# Patient Record
Sex: Female | Born: 1970 | Race: White | Hispanic: No | Marital: Married | Smoking: Never smoker
Health system: Southern US, Community
[De-identification: ages and names within clinical notes are randomized; demographics above are authoritative.]

## PROBLEM LIST (undated history)

## (undated) DIAGNOSIS — R102 Pelvic and perineal pain unspecified side: Secondary | ICD-10-CM

## (undated) DIAGNOSIS — Z8719 Personal history of other diseases of the digestive system: Secondary | ICD-10-CM

## (undated) DIAGNOSIS — M549 Dorsalgia, unspecified: Secondary | ICD-10-CM

## (undated) DIAGNOSIS — N3010 Interstitial cystitis (chronic) without hematuria: Secondary | ICD-10-CM

## (undated) DIAGNOSIS — D649 Anemia, unspecified: Secondary | ICD-10-CM

## (undated) DIAGNOSIS — I10 Essential (primary) hypertension: Secondary | ICD-10-CM

## (undated) DIAGNOSIS — F32A Depression, unspecified: Secondary | ICD-10-CM

## (undated) DIAGNOSIS — R110 Nausea: Secondary | ICD-10-CM

## (undated) DIAGNOSIS — N83209 Unspecified ovarian cyst, unspecified side: Secondary | ICD-10-CM

## (undated) DIAGNOSIS — Z9884 Bariatric surgery status: Secondary | ICD-10-CM

## (undated) DIAGNOSIS — G43909 Migraine, unspecified, not intractable, without status migrainosus: Secondary | ICD-10-CM

## (undated) DIAGNOSIS — G8929 Other chronic pain: Secondary | ICD-10-CM

## (undated) DIAGNOSIS — R000 Tachycardia, unspecified: Secondary | ICD-10-CM

## (undated) DIAGNOSIS — Z789 Other specified health status: Secondary | ICD-10-CM

## (undated) DIAGNOSIS — F329 Major depressive disorder, single episode, unspecified: Secondary | ICD-10-CM

## (undated) DIAGNOSIS — N809 Endometriosis, unspecified: Secondary | ICD-10-CM

## (undated) HISTORY — PX: OTHER SURGICAL HISTORY: SHX169

## (undated) HISTORY — PX: GASTRIC BYPASS: SHX52

## (undated) HISTORY — PX: DILATION AND CURETTAGE OF UTERUS: SHX78

---
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
## 1998-11-11 ENCOUNTER — Other Ambulatory Visit: Admission: RE | Admit: 1998-11-11 | Discharge: 1998-11-11 | Payer: Self-pay | Admitting: Obstetrics and Gynecology

## 1999-12-05 ENCOUNTER — Other Ambulatory Visit: Admission: RE | Admit: 1999-12-05 | Discharge: 1999-12-05 | Payer: Self-pay | Admitting: Obstetrics and Gynecology

## 2000-12-11 ENCOUNTER — Other Ambulatory Visit: Admission: RE | Admit: 2000-12-11 | Discharge: 2000-12-11 | Payer: Self-pay | Admitting: Obstetrics and Gynecology

## 2001-12-15 ENCOUNTER — Other Ambulatory Visit: Admission: RE | Admit: 2001-12-15 | Discharge: 2001-12-15 | Payer: Self-pay | Admitting: Obstetrics and Gynecology

## 2003-01-08 ENCOUNTER — Other Ambulatory Visit: Admission: RE | Admit: 2003-01-08 | Discharge: 2003-01-08 | Payer: Self-pay | Admitting: Obstetrics and Gynecology

## 2003-02-10 ENCOUNTER — Ambulatory Visit (HOSPITAL_COMMUNITY): Admission: RE | Admit: 2003-02-10 | Discharge: 2003-02-10 | Payer: Self-pay | Admitting: Obstetrics and Gynecology

## 2003-02-10 ENCOUNTER — Encounter (INDEPENDENT_AMBULATORY_CARE_PROVIDER_SITE_OTHER): Payer: Self-pay

## 2003-02-10 HISTORY — PX: HYSTEROSCOPY W/D&C: SHX1775

## 2003-03-31 ENCOUNTER — Encounter: Admission: RE | Admit: 2003-03-31 | Discharge: 2003-06-29 | Payer: Self-pay | Admitting: *Deleted

## 2003-04-14 ENCOUNTER — Encounter: Payer: Self-pay | Admitting: Cardiology

## 2003-04-14 ENCOUNTER — Ambulatory Visit (HOSPITAL_COMMUNITY): Admission: RE | Admit: 2003-04-14 | Discharge: 2003-04-14 | Payer: Self-pay | Admitting: *Deleted

## 2003-04-15 ENCOUNTER — Ambulatory Visit (HOSPITAL_BASED_OUTPATIENT_CLINIC_OR_DEPARTMENT_OTHER): Admission: RE | Admit: 2003-04-15 | Discharge: 2003-04-15 | Payer: Self-pay | Admitting: *Deleted

## 2003-04-16 ENCOUNTER — Encounter (INDEPENDENT_AMBULATORY_CARE_PROVIDER_SITE_OTHER): Payer: Self-pay | Admitting: Internal Medicine

## 2003-04-16 ENCOUNTER — Encounter: Admission: RE | Admit: 2003-04-16 | Discharge: 2003-04-16 | Payer: Self-pay | Admitting: *Deleted

## 2003-05-17 ENCOUNTER — Encounter: Payer: Self-pay | Admitting: *Deleted

## 2003-05-18 ENCOUNTER — Observation Stay (HOSPITAL_COMMUNITY): Admission: EM | Admit: 2003-05-18 | Discharge: 2003-05-18 | Payer: Self-pay | Admitting: *Deleted

## 2003-05-19 ENCOUNTER — Encounter (HOSPITAL_COMMUNITY): Admission: RE | Admit: 2003-05-19 | Discharge: 2003-06-18 | Payer: Self-pay | Admitting: *Deleted

## 2003-05-19 ENCOUNTER — Encounter: Payer: Self-pay | Admitting: Cardiology

## 2003-05-19 ENCOUNTER — Encounter (INDEPENDENT_AMBULATORY_CARE_PROVIDER_SITE_OTHER): Payer: Self-pay | Admitting: Internal Medicine

## 2003-06-14 ENCOUNTER — Inpatient Hospital Stay (HOSPITAL_COMMUNITY): Admission: RE | Admit: 2003-06-14 | Discharge: 2003-06-17 | Payer: Self-pay | Admitting: General Surgery

## 2003-06-14 HISTORY — PX: ROUX-EN-Y PROCEDURE: SUR1287

## 2003-06-15 ENCOUNTER — Encounter: Payer: Self-pay | Admitting: Surgery

## 2003-06-15 ENCOUNTER — Encounter (INDEPENDENT_AMBULATORY_CARE_PROVIDER_SITE_OTHER): Payer: Self-pay | Admitting: Family Medicine

## 2003-07-22 ENCOUNTER — Encounter: Admission: RE | Admit: 2003-07-22 | Discharge: 2003-10-20 | Payer: Self-pay | Admitting: *Deleted

## 2004-01-19 ENCOUNTER — Other Ambulatory Visit: Admission: RE | Admit: 2004-01-19 | Discharge: 2004-01-19 | Payer: Self-pay | Admitting: Obstetrics and Gynecology

## 2004-03-23 ENCOUNTER — Encounter: Admission: RE | Admit: 2004-03-23 | Discharge: 2004-03-23 | Payer: Self-pay | Admitting: *Deleted

## 2004-11-06 ENCOUNTER — Encounter: Admission: RE | Admit: 2004-11-06 | Discharge: 2004-11-06 | Payer: Self-pay | Admitting: Neurology

## 2005-02-01 ENCOUNTER — Other Ambulatory Visit: Admission: RE | Admit: 2005-02-01 | Discharge: 2005-02-01 | Payer: Self-pay | Admitting: Obstetrics and Gynecology

## 2005-09-24 HISTORY — PX: ENDOMETRIAL ABLATION W/ NOVASURE: SUR434

## 2006-01-02 ENCOUNTER — Ambulatory Visit (HOSPITAL_BASED_OUTPATIENT_CLINIC_OR_DEPARTMENT_OTHER): Admission: RE | Admit: 2006-01-02 | Discharge: 2006-01-02 | Payer: Self-pay | Admitting: Urology

## 2006-01-02 ENCOUNTER — Encounter (INDEPENDENT_AMBULATORY_CARE_PROVIDER_SITE_OTHER): Payer: Self-pay | Admitting: Specialist

## 2006-01-02 ENCOUNTER — Encounter (INDEPENDENT_AMBULATORY_CARE_PROVIDER_SITE_OTHER): Payer: Self-pay | Admitting: Family Medicine

## 2006-01-02 HISTORY — PX: OTHER SURGICAL HISTORY: SHX169

## 2006-02-04 ENCOUNTER — Ambulatory Visit: Payer: Self-pay | Admitting: Internal Medicine

## 2006-03-04 ENCOUNTER — Ambulatory Visit: Payer: Self-pay | Admitting: Internal Medicine

## 2006-03-06 ENCOUNTER — Telehealth (INDEPENDENT_AMBULATORY_CARE_PROVIDER_SITE_OTHER): Payer: Self-pay | Admitting: Internal Medicine

## 2006-04-03 ENCOUNTER — Ambulatory Visit: Payer: Self-pay | Admitting: Internal Medicine

## 2006-05-01 ENCOUNTER — Ambulatory Visit: Payer: Self-pay | Admitting: Internal Medicine

## 2006-05-09 ENCOUNTER — Telehealth (INDEPENDENT_AMBULATORY_CARE_PROVIDER_SITE_OTHER): Payer: Self-pay | Admitting: Internal Medicine

## 2006-05-10 ENCOUNTER — Ambulatory Visit: Payer: Self-pay | Admitting: Internal Medicine

## 2006-05-27 ENCOUNTER — Encounter (INDEPENDENT_AMBULATORY_CARE_PROVIDER_SITE_OTHER): Payer: Self-pay | Admitting: Internal Medicine

## 2006-05-31 ENCOUNTER — Encounter (INDEPENDENT_AMBULATORY_CARE_PROVIDER_SITE_OTHER): Payer: Self-pay | Admitting: Internal Medicine

## 2006-06-05 ENCOUNTER — Ambulatory Visit: Payer: Self-pay | Admitting: Internal Medicine

## 2006-07-10 ENCOUNTER — Ambulatory Visit: Payer: Self-pay | Admitting: Internal Medicine

## 2006-07-19 ENCOUNTER — Telehealth (INDEPENDENT_AMBULATORY_CARE_PROVIDER_SITE_OTHER): Payer: Self-pay | Admitting: Internal Medicine

## 2006-10-01 ENCOUNTER — Telehealth (INDEPENDENT_AMBULATORY_CARE_PROVIDER_SITE_OTHER): Payer: Self-pay | Admitting: Internal Medicine

## 2006-10-11 ENCOUNTER — Encounter (INDEPENDENT_AMBULATORY_CARE_PROVIDER_SITE_OTHER): Payer: Self-pay | Admitting: Internal Medicine

## 2006-10-28 ENCOUNTER — Encounter: Payer: Self-pay | Admitting: Internal Medicine

## 2006-10-28 DIAGNOSIS — F329 Major depressive disorder, single episode, unspecified: Secondary | ICD-10-CM

## 2006-10-28 DIAGNOSIS — N840 Polyp of corpus uteri: Secondary | ICD-10-CM

## 2006-10-28 DIAGNOSIS — M545 Low back pain, unspecified: Secondary | ICD-10-CM | POA: Insufficient documentation

## 2006-10-28 DIAGNOSIS — K589 Irritable bowel syndrome without diarrhea: Secondary | ICD-10-CM | POA: Insufficient documentation

## 2006-10-28 DIAGNOSIS — R32 Unspecified urinary incontinence: Secondary | ICD-10-CM | POA: Insufficient documentation

## 2006-10-28 DIAGNOSIS — K5900 Constipation, unspecified: Secondary | ICD-10-CM | POA: Insufficient documentation

## 2006-10-28 DIAGNOSIS — F411 Generalized anxiety disorder: Secondary | ICD-10-CM | POA: Insufficient documentation

## 2006-10-28 DIAGNOSIS — N318 Other neuromuscular dysfunction of bladder: Secondary | ICD-10-CM

## 2006-12-05 ENCOUNTER — Telehealth (INDEPENDENT_AMBULATORY_CARE_PROVIDER_SITE_OTHER): Payer: Self-pay | Admitting: *Deleted

## 2007-01-06 ENCOUNTER — Encounter (INDEPENDENT_AMBULATORY_CARE_PROVIDER_SITE_OTHER): Payer: Self-pay | Admitting: Internal Medicine

## 2007-01-09 ENCOUNTER — Encounter (INDEPENDENT_AMBULATORY_CARE_PROVIDER_SITE_OTHER): Payer: Self-pay | Admitting: Internal Medicine

## 2008-04-28 ENCOUNTER — Emergency Department (HOSPITAL_COMMUNITY): Admission: EM | Admit: 2008-04-28 | Discharge: 2008-04-28 | Payer: Self-pay | Admitting: Emergency Medicine

## 2010-01-26 ENCOUNTER — Emergency Department (HOSPITAL_COMMUNITY): Admission: EM | Admit: 2010-01-26 | Discharge: 2010-01-26 | Payer: Self-pay | Admitting: Emergency Medicine

## 2010-11-20 ENCOUNTER — Emergency Department (HOSPITAL_COMMUNITY)
Admission: EM | Admit: 2010-11-20 | Discharge: 2010-11-21 | Disposition: A | Discharge status: Home / Self Care | Payer: 59 | Attending: Emergency Medicine | Admitting: Emergency Medicine

## 2010-11-20 DIAGNOSIS — Z79899 Other long term (current) drug therapy: Secondary | ICD-10-CM | POA: Insufficient documentation

## 2010-11-20 DIAGNOSIS — F3289 Other specified depressive episodes: Secondary | ICD-10-CM | POA: Insufficient documentation

## 2010-11-20 DIAGNOSIS — F329 Major depressive disorder, single episode, unspecified: Secondary | ICD-10-CM | POA: Insufficient documentation

## 2010-11-20 DIAGNOSIS — F1910 Other psychoactive substance abuse, uncomplicated: Secondary | ICD-10-CM | POA: Insufficient documentation

## 2010-11-20 LAB — CBC
HCT: 33 % — ABNORMAL LOW (ref 36.0–46.0)
Hemoglobin: 11 g/dL — ABNORMAL LOW (ref 12.0–15.0)
MCHC: 33.3 g/dL (ref 30.0–36.0)
MCV: 86.4 fL (ref 78.0–100.0)
RDW: 13.8 % (ref 11.5–15.5)
WBC: 6.2 10*3/uL (ref 4.0–10.5)

## 2010-11-20 LAB — RAPID URINE DRUG SCREEN, HOSP PERFORMED
Amphetamines: NOT DETECTED
Cocaine: NOT DETECTED
Opiates: POSITIVE — AB
Tetrahydrocannabinol: NOT DETECTED

## 2010-11-20 LAB — DIFFERENTIAL
Basophils Absolute: 0 10*3/uL (ref 0.0–0.1)
Eosinophils Relative: 4 % (ref 0–5)
Lymphocytes Relative: 44 % (ref 12–46)
Lymphs Abs: 2.7 10*3/uL (ref 0.7–4.0)
Monocytes Absolute: 0.4 10*3/uL (ref 0.1–1.0)
Neutro Abs: 2.8 10*3/uL (ref 1.7–7.7)

## 2010-11-21 LAB — URINALYSIS, ROUTINE W REFLEX MICROSCOPIC
Ketones, ur: NEGATIVE mg/dL
Nitrite: POSITIVE — AB
Protein, ur: NEGATIVE mg/dL
Urobilinogen, UA: 0.2 mg/dL (ref 0.0–1.0)

## 2010-11-21 LAB — BASIC METABOLIC PANEL
BUN: 13 mg/dL (ref 6–23)
Calcium: 8.9 mg/dL (ref 8.4–10.5)
GFR calc non Af Amer: >60 mL/min (ref >60)
Glucose, Bld: 115 mg/dL — ABNORMAL HIGH (ref 70–99)

## 2010-11-21 LAB — ETHANOL: Alcohol, Ethyl (B): <5 mg/dL (ref 0–10)

## 2010-12-12 LAB — POCT PREGNANCY, URINE: Preg Test, Ur: NEGATIVE

## 2010-12-12 LAB — CBC
HCT: 35.7 % — ABNORMAL LOW (ref 36.0–46.0)
Hemoglobin: 12.4 g/dL (ref 12.0–15.0)
MCV: 91.1 fL (ref 78.0–100.0)
RBC: 3.92 MIL/uL (ref 3.87–5.11)
WBC: 8.8 10*3/uL (ref 4.0–10.5)

## 2010-12-12 LAB — URINALYSIS, ROUTINE W REFLEX MICROSCOPIC
Bilirubin Urine: NEGATIVE
Ketones, ur: NEGATIVE mg/dL
Nitrite: NEGATIVE
Specific Gravity, Urine: 1.006 (ref 1.005–1.030)
Urobilinogen, UA: 0.2 mg/dL (ref 0.0–1.0)

## 2010-12-12 LAB — COMPREHENSIVE METABOLIC PANEL
AST: 18 U/L (ref 0–37)
BUN: 13 mg/dL (ref 6–23)
CO2: 26 mEq/L (ref 19–32)
Chloride: 104 mEq/L (ref 96–112)
Creatinine, Ser: 0.6 mg/dL (ref 0.4–1.2)
GFR calc Af Amer: >60 mL/min (ref >60)
GFR calc non Af Amer: >60 mL/min (ref >60)
Glucose, Bld: 83 mg/dL (ref 70–99)
Total Bilirubin: 0.5 mg/dL (ref 0.3–1.2)

## 2010-12-12 LAB — DIFFERENTIAL
Basophils Absolute: 0 10*3/uL (ref 0.0–0.1)
Eosinophils Absolute: 0.2 10*3/uL (ref 0.0–0.7)
Eosinophils Relative: 2 % (ref 0–5)
Lymphocytes Relative: 34 % (ref 12–46)
Neutrophils Relative %: 56 % (ref 43–77)

## 2011-02-09 NOTE — Op Note (Signed)
NAME:  Jordan Velez, Jordan Velez                          ACCOUNT NO.:  [id number]   MEDICAL RECORD NO.:  [id number]                   PATIENT TYPE:  AMB   LOCATION:  SDC                                  FACILITY:  [facility]   PHYSICIAN:  Dineen Kid. Rana Snare, M.D.                 DATE OF BIRTH:  1970-10-31   DATE OF PROCEDURE:  02/10/2003  DATE OF DISCHARGE:                                 OPERATIVE REPORT   PREOPERATIVE DIAGNOSIS:  Abnormal uterine bleeding and endometrial mass.   POSTOPERATIVE DIAGNOSIS:  Abnormal uterine bleeding and endometrial mass.   PROCEDURE:  Hysteroscopy D&C.   SURGEON:  Dineen Kid. Rana Snare, M.D.   ANESTHESIA:  Monitored anesthesia care and paracervical block.   ESTIMATED BLOOD LOSS:  Less than 10 mL.   SORBITOL DEFICIT:  30 mL.   INDICATIONS FOR PROCEDURE:  The patient is a 40 year old G1, P1 with  menometrorrhagia.  She underwent a sonohysterogram which shows a 3.8 mm  polyp on the left lateral wall.  She presents for hysteroscopy D&C for  evaluation and removal of the polyp and bleeding.  Risks and benefits were  discussed at length.  Informed consent was obtained.   FINDINGS:  Thickened endometrial wall throughout, the polypoid martial in  the right cornu.  Normal-appearing cervix and normal-appearing ostia after  D&C.   DESCRIPTION OF PROCEDURE:  After adequate analgesia, the patient is placed  in the dorsal lithotomy position.  She is sterilely prepped and draped.  The  bladder was sterilely drained.  After sterile prep of the cervix, tenaculum  was placed in the anterior lip of the cervix and a paracervical block was  placed with 1% Xylocaine with 1:100,000 epinephrine 20 mL total used.  Uterus was sounded to 8 cm and easily dilated to a #27 Pratt dilator and the  hysteroscope was inserted.  The above findings were noted.  Because of the  thickened endometrium, we went ahead and proceeded with the curettage which  retrieved small fragments of endometrial tissue after  good _______ was felt  throughout the endometrial cavity by curettage, a relook with a hysteroscope  revealed normal-appearing ostia, normal-appearing cervix and a small  polypoid fragments toward the right cornu.  This was removed with polyp  forceps.  Sharp curettage was performed again and a reexamination revealed  normal-appearing endometrial cavity with no residual polypoid or thickened  endometrium.  At this point, the hysteroscope was removed.  The tenaculum  was removed from the cervix.  Abdomen was noted to be hemostatic.  Speculum  was then removed and the patient was transferred to the recovery room in  stable condition.  The estimated blood loss was less than 10 mL.  The  sorbitol deficit during the procedure was 30 mL.    DISPOSITION:  The patient is discharged home and will follow up in the  office in two to three weeks, set  up with a routine instruction sheet for  D&C.  She was told to return for increased pain, bleeding or fever.                                               Dineen Kid Rana Snare, M.D.    DCL/MEDQ  D:  02/10/2003  T:  02/10/2003  Job:  [id number]

## 2011-02-09 NOTE — Op Note (Signed)
NAME:  Jordan Velez, Jordan Velez                          ACCOUNT NO.:  [id number]   MEDICAL RECORD NO.:  [id number]                   PATIENT TYPE:  INP   LOCATION:  0001                                 FACILITY:  [facility]   PHYSICIAN:  Sandria Bales. Ezzard Standing, M.D.               DATE OF BIRTH:  1971/07/06   DATE OF PROCEDURE:  06/14/2003  DATE OF DISCHARGE:                                 OPERATIVE REPORT   PREOPERATIVE DIAGNOSES:  Morbid obesity with a BMI of approximately 44.   POSTOPERATIVE DIAGNOSES:  Morbid obesity a BMI of approximately 44.   PROCEDURE:  Laparoscopic Roux-en-Y gastrojejunostomy which is antecolic  antegastric and then esophagogastroscopy with biopsy by Dr. Jaclynn Guarneri.   SURGEON:  Sandria Bales. Ezzard Standing, M.D.   FIRST ASSISTANT:  Thornton Park. Daphine Deutscher, M.D. and Sharlet Salina T. Hoxworth, M.D.   ANESTHESIA:  General endotracheal.   ESTIMATED BLOOD LOSS:  Minimal.   INDICATIONS FOR PROCEDURE:  Ms. Myer is a 40 year old white female who has  been morbidly obese much of her adult life. She has multiple comorbid  problems including easily fatigues,  lower extremity degenerative joint  disease, menstrual irregularities and hypertension. She has been through our  preoperative evaluation of her morbid obesity and now comes for laparoscopic  roux-en-CY gastrojejunostomy. The potential indications and complications of  the procedure explained to the patient. Complications include but not  limited to bleeding, infection, leak from the bowel, deep venous thrombosis,  long-term nutritional consequences.   DESCRIPTION OF PROCEDURE:  The patient placed in the supine position with a  general endotracheal anesthetic as supervised by Dr. Eilene Ghazi. She has  had subcu Lovenox and Ancef as preoperative medications. She had PAS  stockings in place, her abdomen was prepped with Betadine solution and  sterilely draped.   I accessed the abdomen through the left upper quadrant using a Veress needle  into  the abdomen and insufflated this with CO2. I used the Optiview trocar  to insert into the abdominal cavity and I placed a second left subcostal  trocar with a 5 mm and left paramedian 12 mm,  right paramedian 12 mm, right  subcostal 12 mm and a right paraumbilical 10 mm trocars.   Abdominal exploration was carried out. The right and left lobes of the liver  were unremarkable. The anterior wall of the stomach was unremarkable. The  omentum was stuck down in the pelvis probably from the prior tubal ligation.  The ovaries were stuck on both lateral pelvic sidewalls but there was no  mass or nodularity. There was no other mass or lesion. I had to take the  omentum down at the initiation of the procedure.   I then went and found the ligament of Treitz. The ligament of Treitz was  actually peculiar in that the fourth portion of the ligament of Treitz made  kind of an S shape in the retroperitoneum, I  counted 40 cm beyond the  ligament of Treitz, divided the small bowel with a flat load of a GIA  stapler.   I then counted 100 cm from the cut end of the bowel down to where I would  make my jejunojejunostomy. I marked the gastric wound with a Penrose drain  and I did a stapled side to side anastomosis with a white load of the GIA  stapler and then I closed the enterotomy with a running 2-0 Vicryl sutures,  two sutures that tied in the middle and then some interrupted 2-0 Vicryls.   I then closed the mesentery with a running 2-0 silk suture using a lapper  tie on both ends. I then applied Tisseal and marked this in with two clips.   I then went up into the upper abdomen, placed the [facility] liver retractor,  identified the angle of His, along the left side of the esophagus and cardia  of the stomach and identified the left crus.   After creating a pocket there, I then went down along the lesser curvature  of the stomach approximately 4-5 cm immediately before the second main  vessel of the  stomach and got into the lesser sac from that direction. I  then used five firings of the blue Endo GIA stapler creating a tube-like  pouch which was approximately 5 cm in length, 3-4 cm in width. Hemostasis  was controlled both with the staples itself and then I had to put some  additional clips both on the gastric remnant side and on the stomach side.   I then brought the jejunal limb up antecolic antegastric, did a running  suture to the small bowel to the stomach, tied the distal end, used a liga  tie on the right end of this posterior suture. I then used again another  blue load of the Endo GIA after I had made enterotomies both into the  stomach and into the small bowel, fired the Endo GIA stapler getting back to  about the E on the 45 stapler trying to make about a 2 1/2 cm opening. I  then closed the enterotomy with two running 2-0 Vicryl sutures, I advanced  the Ewald tube through the anastomosis, made sure this was patent and did an  anterior layer with 2-0 Vicryl sutures with a liga tie or a laparotomy tie  on both ends.   At this point, Dr. Johna Sheriff went up to the head of the bed, passed an  endoscope without difficulty down the esophagus. I clamped the small bowel,  he visualized the stomach, there was no bleeding within the stomach. The  anastomosis was patent. The anastomosis was approximately 44 cm from the  incisors, the GE junction 39-40 cm from the incisors for a 4-5 cm length  pouch. Because she had a positive C Dif antibody preoperatively, we did a  CLO biopsy on her also. Dr. Johna Sheriff will dictate the portion of the  esophagogastroduodenoscopy.   I then placed Tisseel on the anterior stomach.   I then placed a Blake drain through the left lateral stab wound, sewed this  in placed, placed it up under the liver of the esophagus anteriorly. The trocars were then all removed in turn, no bleeding at any of the trocar  sites. I closed the skin at each site with the  staples, infiltrated the skin  with approximately 20 mL of 0.5% Marcaine with epinephrine. The patient  tolerated the procedure well and was transported to  the recovery room in  good condition. Sponge, needle and instrument counts were correct at the end  of the case.                                               Sandria Bales. Ezzard Standing, M.D.   DHN/MEDQ  D:  06/14/2003  T:  06/14/2003  Job:  [id number]   cc:   Dineen Kid. Rana Snare, M.D.  [address]  [place]  [place] [address]  Fax: [phone]

## 2011-02-09 NOTE — Op Note (Signed)
NAME:  Jordan Velez, Jordan Velez                ACCOUNT NO.:  [id number]   MEDICAL RECORD NO.:  [id number]          PATIENT TYPE:  AMB   LOCATION:  NESC                         FACILITY:  [facility]   PHYSICIAN:  Ronald L. Earlene Plater, M.D.  DATE OF BIRTH:  1971-04-23   DATE OF PROCEDURE:  01/02/2006  DATE OF DISCHARGE:                                 OPERATIVE REPORT   DIAGNOSIS:  Possible interstitial cystitis.   OPERATIVE PROCEDURE:  Cystourethroscopy, hydraulic bladder distention,  bladder biopsy.   SURGEON:  Lucrezia Starch. Earlene Plater, M.D.   ANESTHESIA:  LMA.   ESTIMATED BLOOD LOSS:  Negligible.   TUBES:  None.   COMPLICATIONS:  None.   INDICATIONS FOR PROCEDURE:  Ms. Bazzle is a lovely 40 year old white female  who presents with persistent pelvic pain.  She has a history of recurrent  cystitis and on CT scan of the pelvis had no significant abnormalities.  She  had been on amitriptyline which has helped.  Does take Vicodin periodically.  She has some urgency and also has dysuria.  After understanding risks and  alternatives, she has elected to proceed with cysto, hydraulic bladder  distention, and bladder biopsies.   PROCEDURE IN DETAIL:  The patient was placed in the supine position.  After  appropriate LMA anesthesia, was placed in the dorsal lithotomy position and  prepped and draped in normal sterile fashion.  Cystourethroscopy was  performed with a 22.5-French Olympus panendoscope.  Utilizing the 12- and 70-  degree lenses, the bladder was carefully inspected and noted to be without  lesions.  Efflux of clear urine was noted from the normally-placed ureteral  orifices bilaterally.  Hydraulic bladder distention was performed and a  pressure of 80 cmH2O was well over 1000 cc.  The bladder was drained and no  submucosal glomerulations or cracks were noted.  A biopsy was taken from the  posterior midline and submitted to pathology and the base cauterized with  Bovie coagulation cautery.  The bladder  was drained.  The panendoscope was  removed and the patient was taken to the recovery room stable.      Ronald L. Earlene Plater, M.D.  Electronically Signed     RLD/MEDQ  D:  01/02/2006  T:  01/02/2006  Job:  [id number]

## 2011-02-09 NOTE — Op Note (Signed)
   NAME:  CHANDELL, ATTRIDGE                          ACCOUNT NO.:  [id number]   MEDICAL RECORD NO.:  [id number]                   PATIENT TYPE:  INP   LOCATION:  0463                                 FACILITY:  [facility]   PHYSICIAN:  Lorne Skeens. Hoxworth, M.D.          DATE OF BIRTH:  09-05-1971   DATE OF PROCEDURE:  DATE OF DISCHARGE:                                 OPERATIVE REPORT   REASON FOR PROCEDURE:  The patient is endoscoped at the completion of a Roux-  en-Y gastric bypass performed laparoscopically by Dr. Ovidio Kin to assist  the anastomosis and for leaks and also to obtain a biopsy due to an  Helicobacter pylori antibody positive test.   SURGEON:  Lorne Skeens. Hoxworth, M.D.   DESCRIPTION OF PROCEDURE:  At the completion of the laparoscopic Roux-en-Y  bypass the endoscope was introduced into the esophagus and then passed under  direct vision to the EG junction which was at 39 cm from the incisors.  The  small gastric pouch was entered and there was no evidence of bleeding with  intact staple lines and a widely patent anastomosis.  The pouch was  distended with air and checked intra-abdominally under saline for leaks.  The pouch was measured and was approximately 5 cm in length.  Following this  a mucosal biopsy was obtained from the anterior wall of the gastric pouch  using flexible biopsy forceps.  There was no significant bleeding from the  biopsy site.  The air was suctioned and the scope withdrawn.                                               Lorne Skeens. Hoxworth, M.D.    Tory Emerald  D:  06/14/2003  T:  06/14/2003  Job:  [id number]

## 2011-02-09 NOTE — Discharge Summary (Signed)
NAME:  Jordan Velez, Jordan Velez                          ACCOUNT NO.:  [id number]   MEDICAL RECORD NO.:  [id number]                   PATIENT TYPE:  INP   LOCATION:  0463                                 FACILITY:  [facility]   PHYSICIAN:  Sandria Bales. Ezzard Standing, M.D.               DATE OF BIRTH:  June 11, 1971   DATE OF ADMISSION:  06/14/2003  DATE OF DISCHARGE:  06/17/2003                                 DISCHARGE SUMMARY   DISCHARGE DIAGNOSES:  1. Morbidly obese.  2. Dyspnea on exertion secondary to her weight status.  3. Lower extremity degenerative joint disease.  4. Menstrual irregularities.  5. Hypertensive.  6. Helicobacter pylori antibody positive.   PROCEDURE:  Laparoscopic Roux-en-Y gastrojejunostomy, esophagogastroscopy on  June 14, 2003.   HISTORY OF PRESENT ILLNESS:  This is a 40 year old white female who is a  patient of Dr. Candice Camp who has been morbidly obese much of her adult  life.  She has been through our preoperative program with bariatrics and  been to our didactic lecture, has met both a psychiatrist and dietician and  had a physical examination and been evaluated.  She qualifies for the  bariatric surgery with a BMI of 44, her weight is 275 pounds, height is 5  feet 6 inches.   Her comorbid problems include:  1. Easy fatigability with dyspnea on exertion secondary to weight.  2. Lower extremity degenerative joint disease.  3. Menstrual irregularities.  4. Hypertension.  5. She is Helicobacter pylori antibody positive.   The patient completed a preoperative diet and mechanical antibiotic bowel  prep and presented to the hospital on June 14, 2003.   She underwent a laparoscopic Roux-en-Y gastrojejunostomy that was performed  by me and had an esophagogastroscopy performed by Dr. Jaclynn Guarneri.  We also  obtained a biopsy of her stomach with a CLOtest to check for H. pylori.  Her  CLOtest was negative.  Postoperatively, she did well.  She had her swallow  the first  postoperative day which was negative and there no evidence of  leaks.  Her Doppler studies of the lower extremities showed no evidence of  deep venous thrombosis.  She was started on clear liquids and advanced to  Glucerna and clear liquids on the second postoperative day.  She was also  restarted on her reduced dose of Paxil which she has been on.   By the third postoperative day, she had some gas pain in the left upper  quadrant but she was doing well.  Her pulse was 84.  Her second white blood  cell count had been 10,600. Her lungs were clear and she was ready for  discharge.   Her discharge diet had already been reviewed and she knew what to take.  She  was given Roxicet for pain.  She was to walk.  She could shower, and just  dress around her drain.  She is  going to see Yolonda Kida, I think on  June 21, 2003, which was Monday, for evaluation and see me later the  same week.   CONDITION ON DISCHARGE:  Good.                                                Sandria Bales. Ezzard Standing, M.D.    DHN/MEDQ  D:  06/27/2003  T:  06/28/2003  Job:  [id number]   cc:   Dineen Kid. Rana Snare, M.D.  [address]  [place]  [place] [address]  Fax: [phone]

## 2011-02-09 NOTE — Discharge Summary (Signed)
NAME:  Jordan Velez, Jordan Velez                          ACCOUNT NO.:  [id number]   MEDICAL RECORD NO.:  [id number]                   PATIENT TYPE:  INP   LOCATION:  A212                                 FACILITY:  [facility]   PHYSICIAN:  Hanley Hays. Dechurch, M.D.           DATE OF BIRTH:  [birth year]   DATE OF ADMISSION:  05/17/2003  DATE OF DISCHARGE:  05/18/2003                                 DISCHARGE SUMMARY   DIAGNOSES:  1. Atypical chest pain.  2. Morbid obesity.  3. Anxiety disorder.   DISPOSITION:  1. The patient is discharged to home.  2. Stress Cardiolite May 19, 2003.  3. Follow up with bariatric surgery clinic with Dr. Domenica Fail.  4. Follow up with her usual outpatient physician at [facility]gent [facility].  5. The patient recommended to obtain local primary M.D. to assist with     management at home.   HOSPITAL COURSE:  A 40 year old Caucasian female with morbid obesity who was  in the process of preoperative testing for upcoming bariatric surgery in  September.  The patient presented to the emergency room with substernal  chest pain radiating to the left shoulder which was atypical in nature.  She  was admitted to telemetry.  She had normal CKs; troponin was 0.03, follow-up  troponin was 0.01.  EKGs were normal though she had decreased amplitude  throughout the tracing.  D-dimer was normal at 0.22. It was felt that her  chest pain, given her history, was possibly musculoskeletal but probably not  cardiac but due to the fact that she has major surgery impending stress  Cardiolite will be performed.  She is stable therefore to be done as an  outpatient.  The patient is in stable condition, no changes to her medical  regimen were made, and she is discharged with the follow-up as noted above.  Physical exam at the time of discharge reveals a well-developed, well-  nourished, obese white female.  Blood pressure 126/70, pulse 75 and regular,  weight 274.  No edema is noted.  The exam is  somewhat limited by her  habitus, but lungs are clear, heart regular, no murmur.  The abdomen is  obese.  Dorsalis pedis pulses and posterior tibial pulses are normal  bilaterally.  Neurologically was intact and appropriate.    PLAN:  Atypical chest pain and planned gastric bypass.  The patient was  reassured that this probably will have no negative effects on her upcoming  surgery which she is very anxious to undertake.                                               Hanley Hays Josefine Class, M.D.    FED/MEDQ  D:  05/20/2003  T:  05/21/2003  Job:  [id number]   cc:  Vikki Ports, M.D.  [address]  [place]  [place] [address]  Fax: [phone]   Dr. Helm/McGowen

## 2011-02-09 NOTE — H&P (Signed)
   NAME:  Jordan Velez, Jordan Velez                          ACCOUNT NO.:  [id number]   MEDICAL RECORD NO.:  [id number]                   PATIENT TYPE:  AMB   LOCATION:  SDC                                  FACILITY:  [facility]   PHYSICIAN:  Dineen Kid. Rana Snare, M.D.                 DATE OF BIRTH:  02/11/1971   DATE OF ADMISSION:  02/10/2003  DATE OF DISCHARGE:                                HISTORY & PHYSICAL   HISTORY OF PRESENT ILLNESS:  The patient is a 40 year old G1, P1 with  menometorrhagia who underwent a sonohistogram which shows a 3.8 mm  endometrial polyp in the left lateral wall.  She presents for hysteroscopy  D&C for evaluation and removal of this polyp.   PAST MEDICAL HISTORY:  1. Obesity.  2. Currently planning to have gastric bypass surgery.  3. Hypertension on hydrochlorothiazide and __________.  4. History of anxiety disorder currently on Paxil.  5. Insomnia on Ambien.   ALLERGIES:  No known drug allergies.   PHYSICAL EXAMINATION:  VITAL SIGNS:  Blood pressure 128/94.  HEART:  Regular rate and rhythm.  LUNGS:  Clear to auscultation bilaterally.  ABDOMEN:  Obese, nontender.  PELVIC:  Exam is deferred, but ultrasound shows a 10.8 x 5.3 x 3.9 cm  uterus.  The ovaries are slightly enlarged, polycystic ovaries in  appearance.  Sonohistogram shows a 3.8 mm polyp at left lateral wall.   IMPRESSION AND PLAN:  Abnormal uterine bleeding with endometrial mass  consistent with polyp.  Recommend hysteroscopy D&C for evaluation and  removal of this.  Risks and benefits are discussed at length which include  but are not limited to risk of infection, bleeding, damage to uterus, tubes,  and ovaries bilaterally, bladder, risk of recurrence of endometrial polyp  and also risk of not being able to alleviate the bleeding.  At this point,  she does give an informed consent.                                               Dineen Kid Rana Snare, M.D.    DCL/MEDQ  D:  02/09/2003  T:  02/09/2003  Job:   [id number]

## 2011-03-23 ENCOUNTER — Emergency Department (HOSPITAL_COMMUNITY)
Admission: EM | Admit: 2011-03-23 | Discharge: 2011-03-23 | Disposition: A | Discharge status: Home / Self Care | Payer: 59 | Attending: Emergency Medicine | Admitting: Emergency Medicine

## 2011-03-23 ENCOUNTER — Emergency Department (HOSPITAL_COMMUNITY): Payer: 59

## 2011-03-23 DIAGNOSIS — F329 Major depressive disorder, single episode, unspecified: Secondary | ICD-10-CM | POA: Insufficient documentation

## 2011-03-23 DIAGNOSIS — F3289 Other specified depressive episodes: Secondary | ICD-10-CM | POA: Insufficient documentation

## 2011-03-23 DIAGNOSIS — F411 Generalized anxiety disorder: Secondary | ICD-10-CM | POA: Insufficient documentation

## 2011-03-23 DIAGNOSIS — S21009A Unspecified open wound of unspecified breast, initial encounter: Secondary | ICD-10-CM | POA: Insufficient documentation

## 2011-03-23 DIAGNOSIS — Z79899 Other long term (current) drug therapy: Secondary | ICD-10-CM | POA: Insufficient documentation

## 2011-03-23 DIAGNOSIS — W540XXA Bitten by dog, initial encounter: Secondary | ICD-10-CM | POA: Insufficient documentation

## 2011-03-23 DIAGNOSIS — M79609 Pain in unspecified limb: Secondary | ICD-10-CM | POA: Insufficient documentation

## 2011-03-23 DIAGNOSIS — S81009A Unspecified open wound, unspecified knee, initial encounter: Secondary | ICD-10-CM | POA: Insufficient documentation

## 2011-03-23 DIAGNOSIS — I10 Essential (primary) hypertension: Secondary | ICD-10-CM | POA: Insufficient documentation

## 2013-09-09 ENCOUNTER — Encounter (HOSPITAL_BASED_OUTPATIENT_CLINIC_OR_DEPARTMENT_OTHER): Payer: Self-pay | Admitting: *Deleted

## 2013-09-09 NOTE — Progress Notes (Signed)
NPO AFTER MN. ARRIVE AT 0630.  PRE-OP ORDERS PENDING.  NEEDS ISTAT AND EKG. WILL TAKE HYDROCODONE AND KLONOPIN AM DOS W/ SIPS OF WATER.

## 2013-09-10 NOTE — H&P (Addendum)
  S: Jordan Velez presents today for annual exam and Pap smear.  Continuing to have problems with ovarian cyst pain or rupture.  She states that every 2 or 3 months, she will have severe enough pain that it will double her over.  She was coming back from the beach one time and had a lot of issues where she had to lay around in the back and really could not sit up.  We have evaluated for her this in the past.  She had a lot of problems and side effects from Depo-Provera and cannot do birth control pills, so we are limited as far as what we can do.  She does not have menstrual cycles as she has had a NovaSure and has done well with that.  In the past when we did a tubal ligation, we had noted that she had endometriosis and some adhesions with the ovaries and the cul-de-sac.  She subsequently has had laparoscopic weight loss surgery by Dr. Ezzard Standing here in town.  At this time, she is having enough pain that she wants something done because she does have a lot of problems with pain and ends up laying around the house, and her husband wants her to get something done.  She does not want a hysterectomy at this time and is not crazy about going through menopause yet.  The pain used to be more right-sided then it became more left-sided recently, so there is not a side that she would wish removed, but she does wish 1 of the ovaries removed. O: Physical exam:  General:  Alert and oriented.  Normocephalic and atraumatic.  No thyromegaly or masses palpated.  Heart is regular rate and rhythm without murmur.  Lungs are clear to auscultation bilaterally.  Breasts without masses, lymphadenopathy or discharge.  Abdomen is soft, nontender, nondistended.  No rebound or guarding.  No flank pain is noted.  Extremities without clubbing, cyanosis or edema.  Normal external genitalia, Bartholin, Skene's and urethra.  Vagina without lesions.  Cervix within normal limits.  Uterus is anteverted, mobile and nontender.  No adnexal masses are palpable.   No inguinal lymphadenopathy palpable. A/P: 1.  Normal well-woman exam.  Pap smear yearly.  Self-breast exams once a month.  Mammogram yearly.  2.  Pelvic pain, history of endometriosis and recurrent ovarian cysts.  The patient desires some type of definitive surgical evaluation.  Plan laparoscopic evaluation with ablation of endometriosis, C02 laser.  We would like to free up both ovaries, certainly if they are adhesed, removal of whichever one is worse.  The patient does not care which one it is at this time.  If does have a preference, we can remove 1, or we can make a decision at that time.  She would like to preserve at least 1 if possible.  We will schedule this in the near future.  Risks and benefits were discussed at length.  Informed consent was obtained.   I did refill her prescription for Xanax, Ambien and Flexeril.  Dineen Kid Rana Snare, MD/tch  09/11/13 0830 This patient has been seen and examined.   All of her questions were answered.  Labs and vital signs reviewed.  Informed consent has been obtained.  The History and Physical is current. DL

## 2013-09-11 ENCOUNTER — Ambulatory Visit (HOSPITAL_BASED_OUTPATIENT_CLINIC_OR_DEPARTMENT_OTHER): Payer: 59 | Admitting: Anesthesiology

## 2013-09-11 ENCOUNTER — Encounter (HOSPITAL_BASED_OUTPATIENT_CLINIC_OR_DEPARTMENT_OTHER)
Admission: RE | Disposition: A | Discharge status: Home / Self Care | Payer: Self-pay | Source: Ambulatory Visit | Attending: Obstetrics and Gynecology

## 2013-09-11 ENCOUNTER — Encounter (HOSPITAL_BASED_OUTPATIENT_CLINIC_OR_DEPARTMENT_OTHER): Payer: 59 | Admitting: Anesthesiology

## 2013-09-11 ENCOUNTER — Ambulatory Visit (HOSPITAL_BASED_OUTPATIENT_CLINIC_OR_DEPARTMENT_OTHER)
Admission: RE | Admit: 2013-09-11 | Discharge: 2013-09-11 | Disposition: A | Discharge status: Home / Self Care | Payer: 59 | Source: Ambulatory Visit | Attending: Obstetrics and Gynecology | Admitting: Obstetrics and Gynecology

## 2013-09-11 ENCOUNTER — Encounter (HOSPITAL_BASED_OUTPATIENT_CLINIC_OR_DEPARTMENT_OTHER): Payer: Self-pay

## 2013-09-11 DIAGNOSIS — N809 Endometriosis, unspecified: Secondary | ICD-10-CM

## 2013-09-11 DIAGNOSIS — N949 Unspecified condition associated with female genital organs and menstrual cycle: Secondary | ICD-10-CM | POA: Insufficient documentation

## 2013-09-11 DIAGNOSIS — N736 Female pelvic peritoneal adhesions (postinfective): Secondary | ICD-10-CM | POA: Insufficient documentation

## 2013-09-11 DIAGNOSIS — I10 Essential (primary) hypertension: Secondary | ICD-10-CM | POA: Insufficient documentation

## 2013-09-11 DIAGNOSIS — N83209 Unspecified ovarian cyst, unspecified side: Secondary | ICD-10-CM | POA: Insufficient documentation

## 2013-09-11 DIAGNOSIS — R102 Pelvic and perineal pain: Secondary | ICD-10-CM

## 2013-09-11 HISTORY — PX: LAPAROSCOPY: SHX197

## 2013-09-11 HISTORY — DX: Pelvic and perineal pain unspecified side: R10.20

## 2013-09-11 HISTORY — DX: Endometriosis, unspecified: N80.9

## 2013-09-11 HISTORY — DX: Dorsalgia, unspecified: M54.9

## 2013-09-11 HISTORY — DX: Nausea: R11.0

## 2013-09-11 HISTORY — DX: Interstitial cystitis (chronic) without hematuria: N30.10

## 2013-09-11 HISTORY — DX: Migraine, unspecified, not intractable, without status migrainosus: G43.909

## 2013-09-11 HISTORY — PX: SALPINGOOPHORECTOMY: SHX82

## 2013-09-11 HISTORY — DX: Unspecified ovarian cyst, unspecified side: N83.209

## 2013-09-11 HISTORY — DX: Other chronic pain: G89.29

## 2013-09-11 HISTORY — DX: Bariatric surgery status: Z98.84

## 2013-09-11 HISTORY — DX: Essential (primary) hypertension: I10

## 2013-09-11 HISTORY — DX: Pelvic and perineal pain: R10.2

## 2013-09-11 LAB — POCT I-STAT 4, (NA,K, GLUC, HGB,HCT)
Glucose, Bld: 88 mg/dL (ref 70–99)
HCT: 29 % — ABNORMAL LOW (ref 36.0–46.0)
Hemoglobin: 9.9 g/dL — ABNORMAL LOW (ref 12.0–15.0)
Potassium: 4.2 mEq/L (ref 3.5–5.1)
Sodium: 138 mEq/L (ref 135–145)

## 2013-09-11 LAB — CBC
Hemoglobin: 9.4 g/dL — ABNORMAL LOW (ref 12.0–15.0)
MCH: 26.7 pg (ref 26.0–34.0)
MCHC: 33.1 g/dL (ref 30.0–36.0)
MCV: 80.7 fL (ref 78.0–100.0)
RBC: 3.52 MIL/uL — ABNORMAL LOW (ref 3.87–5.11)
WBC: 7.9 10*3/uL (ref 4.0–10.5)

## 2013-09-11 SURGERY — LAPAROSCOPY, DIAGNOSTIC
Anesthesia: General | Site: Abdomen | Laterality: Right

## 2013-09-11 MED ORDER — MIDAZOLAM HCL 5 MG/5ML IJ SOLN
INTRAMUSCULAR | Status: DC | PRN
  Administered 2013-09-11 (×2): 1 mg via INTRAVENOUS

## 2013-09-11 MED ORDER — NEOSTIGMINE METHYLSULFATE 1 MG/ML IJ SOLN
INTRAMUSCULAR | Status: DC | PRN
  Administered 2013-09-11: 4 mg via INTRAVENOUS

## 2013-09-11 MED ORDER — KETOROLAC TROMETHAMINE 30 MG/ML IJ SOLN
INTRAMUSCULAR | Status: DC | PRN
  Administered 2013-09-11: 30 mg via INTRAVENOUS

## 2013-09-11 MED ORDER — PROPOFOL 10 MG/ML IV BOLUS
INTRAVENOUS | Status: DC | PRN
  Administered 2013-09-11: 200 mg via INTRAVENOUS

## 2013-09-11 MED ORDER — BUPIVACAINE HCL (PF) 0.25 % IJ SOLN
INTRAMUSCULAR | Status: DC | PRN
  Administered 2013-09-11: 10 mL

## 2013-09-11 MED ORDER — ONDANSETRON HCL 4 MG/2ML IJ SOLN
INTRAMUSCULAR | Status: DC | PRN
  Administered 2013-09-11: 4 mg via INTRAVENOUS

## 2013-09-11 MED ORDER — OXYCODONE HCL 5 MG PO TABS
ORAL_TABLET | ORAL | Status: AC
  Filled 2013-09-11: qty 1

## 2013-09-11 MED ORDER — DEXTROSE 5 % IV SOLN
2.0000 g | INTRAVENOUS | Status: DC
  Filled 2013-09-11: qty 2

## 2013-09-11 MED ORDER — FENTANYL CITRATE 0.05 MG/ML IJ SOLN
INTRAMUSCULAR | Status: AC
  Filled 2013-09-11: qty 2

## 2013-09-11 MED ORDER — DEXAMETHASONE SODIUM PHOSPHATE 4 MG/ML IJ SOLN
INTRAMUSCULAR | Status: DC | PRN
  Administered 2013-09-11: 10 mg via INTRAVENOUS

## 2013-09-11 MED ORDER — OXYCODONE-ACETAMINOPHEN 5-325 MG PO TABS
1.0000 | ORAL_TABLET | Freq: Four times a day (QID) | ORAL | Status: DC | PRN
  Administered 2013-09-11: 1 via ORAL
  Filled 2013-09-11: qty 1

## 2013-09-11 MED ORDER — OXYCODONE-ACETAMINOPHEN 5-325 MG PO TABS
ORAL_TABLET | ORAL | Status: AC
  Filled 2013-09-11: qty 1

## 2013-09-11 MED ORDER — FENTANYL CITRATE 0.05 MG/ML IJ SOLN
INTRAMUSCULAR | Status: DC | PRN
  Administered 2013-09-11 (×5): 25 ug via INTRAVENOUS
  Administered 2013-09-11: 50 ug via INTRAVENOUS
  Administered 2013-09-11 (×5): 25 ug via INTRAVENOUS

## 2013-09-11 MED ORDER — GLYCOPYRROLATE 0.2 MG/ML IJ SOLN
INTRAMUSCULAR | Status: DC | PRN
  Administered 2013-09-11: 0.6 mg via INTRAVENOUS

## 2013-09-11 MED ORDER — SUCCINYLCHOLINE CHLORIDE 20 MG/ML IJ SOLN
INTRAMUSCULAR | Status: DC | PRN
  Administered 2013-09-11: 120 mg via INTRAVENOUS

## 2013-09-11 MED ORDER — LACTATED RINGERS IV SOLN
INTRAVENOUS | Status: DC | PRN
  Administered 2013-09-11 (×4): via INTRAVENOUS

## 2013-09-11 MED ORDER — PROMETHAZINE HCL 25 MG/ML IJ SOLN
6.2500 mg | INTRAMUSCULAR | Status: DC | PRN
  Filled 2013-09-11: qty 1

## 2013-09-11 MED ORDER — OXYCODONE-ACETAMINOPHEN 10-325 MG PO TABS: 1.0000 | ORAL_TABLET | Freq: Four times a day (QID) | ORAL | Status: DC | PRN

## 2013-09-11 MED ORDER — MEPERIDINE HCL 25 MG/ML IJ SOLN
6.2500 mg | INTRAMUSCULAR | Status: DC | PRN
  Filled 2013-09-11: qty 1

## 2013-09-11 MED ORDER — FENTANYL CITRATE 0.05 MG/ML IJ SOLN
25.0000 ug | INTRAMUSCULAR | Status: DC | PRN
  Administered 2013-09-11 (×2): 50 ug via INTRAVENOUS
  Filled 2013-09-11: qty 1

## 2013-09-11 MED ORDER — HEMOSTATIC AGENTS (NO CHARGE) OPTIME
TOPICAL | Status: DC | PRN
  Administered 2013-09-11: 1 via TOPICAL

## 2013-09-11 MED ORDER — MIDAZOLAM HCL 2 MG/2ML IJ SOLN
INTRAMUSCULAR | Status: AC
  Filled 2013-09-11: qty 2

## 2013-09-11 MED ORDER — OXYCODONE HCL 5 MG PO TABS
5.0000 mg | ORAL_TABLET | ORAL | Status: DC | PRN
  Administered 2013-09-11: 5 mg via ORAL
  Filled 2013-09-11: qty 1

## 2013-09-11 MED ORDER — CLOTRIMAZOLE-BETAMETHASONE 1-0.05 % EX CREA: 1.0000 "application " | TOPICAL_CREAM | Freq: Two times a day (BID) | CUTANEOUS | Status: DC

## 2013-09-11 MED ORDER — LACTATED RINGERS IV SOLN
INTRAVENOUS | Status: DC
  Filled 2013-09-11: qty 1000

## 2013-09-11 MED ORDER — ROCURONIUM BROMIDE 100 MG/10ML IV SOLN
INTRAVENOUS | Status: DC | PRN
  Administered 2013-09-11: 30 mg via INTRAVENOUS
  Administered 2013-09-11: 20 mg via INTRAVENOUS

## 2013-09-11 MED ORDER — LACTATED RINGERS IR SOLN
Status: DC | PRN
  Administered 2013-09-11: 1

## 2013-09-11 MED ORDER — FENTANYL CITRATE 0.05 MG/ML IJ SOLN
INTRAMUSCULAR | Status: AC
  Filled 2013-09-11: qty 6

## 2013-09-11 MED ORDER — CEFOTETAN DISODIUM-DEXTROSE 2-2.08 GM-% IV SOLR
INTRAVENOUS | Status: AC
  Filled 2013-09-11: qty 50

## 2013-09-11 MED ORDER — LIDOCAINE HCL (CARDIAC) 20 MG/ML IV SOLN
INTRAVENOUS | Status: DC | PRN
  Administered 2013-09-11: 100 mg via INTRAVENOUS

## 2013-09-11 MED ORDER — LACTATED RINGERS IV SOLN
INTRAVENOUS | Status: DC
  Administered 2013-09-11: 08:00:00 via INTRAVENOUS
  Filled 2013-09-11: qty 1000

## 2013-09-11 MED ORDER — DEXTROSE 5 % IV SOLN
2.0000 g | INTRAVENOUS | Status: DC | PRN
  Administered 2013-09-11: 2 g via INTRAVENOUS

## 2013-09-11 SURGICAL SUPPLY — 47 items
BLADE 11 SAFETY STRL DISP (BLADE) ×4 IMPLANT
BLADE SURG 15 STRL LF DISP TIS (BLADE) IMPLANT
BLADE SURG 15 STRL SS (BLADE)
CATH ROBINSON RED A/P 16FR (CATHETERS) ×4 IMPLANT
COVER MAYO STAND STRL (DRAPES) ×4 IMPLANT
COVER TABLE BACK 60X90 (DRAPES) ×4 IMPLANT
DERMABOND ADVANCED (GAUZE/BANDAGES/DRESSINGS) ×1
DERMABOND ADVANCED .7 DNX12 (GAUZE/BANDAGES/DRESSINGS) ×3 IMPLANT
DRAPE CAMERA CLOSED 9X96 (DRAPES) ×4 IMPLANT
DRAPE UNDERBUTTOCKS STRL (DRAPE) ×4 IMPLANT
DRSG TEGADERM 2-3/8X2-3/4 SM (GAUZE/BANDAGES/DRESSINGS) ×4 IMPLANT
FILTER SMOKE EVAC LAPAROSHD (FILTER) ×4 IMPLANT
FORCEPS CUTTING 45CM 5MM (CUTTING FORCEPS) ×4 IMPLANT
GLOVE BIO SURGEON STRL SZ8 (GLOVE) ×4 IMPLANT
GLOVE BIOGEL M 6.5 STRL (GLOVE) ×4 IMPLANT
GLOVE BIOGEL M STER SZ 6 (GLOVE) ×4 IMPLANT
GLOVE BIOGEL PI IND STRL 7.5 (GLOVE) ×9 IMPLANT
GLOVE BIOGEL PI INDICATOR 7.5 (GLOVE) ×3
GLOVE SURG ORTHO 8.0 STRL STRW (GLOVE) ×4 IMPLANT
GOWN PREVENTION PLUS LG XLONG (DISPOSABLE) ×4 IMPLANT
GOWN STRL NON-REIN LRG LVL3 (GOWN DISPOSABLE) ×4 IMPLANT
GOWN STRL REIN XL XLG (GOWN DISPOSABLE) ×8 IMPLANT
IV LACTATED RINGER IRRG 3000ML (IV SOLUTION) ×1
IV LR IRRIG 3000ML ARTHROMATIC (IV SOLUTION) ×3 IMPLANT
LEGGING LITHOTOMY PAIR STRL (DRAPES) ×4 IMPLANT
NEEDLE HYPO 25X1 1.5 SAFETY (NEEDLE) ×4 IMPLANT
NEEDLE INSUFFLATION 14GA 120MM (NEEDLE) ×4 IMPLANT
NS IRRIG 500ML POUR BTL (IV SOLUTION) ×4 IMPLANT
PACK BASIN DAY SURGERY FS (CUSTOM PROCEDURE TRAY) ×4 IMPLANT
PACK LAPAROSCOPY II (CUSTOM PROCEDURE TRAY) ×4 IMPLANT
PAD PREP 24X48 CUFFED NSTRL (MISCELLANEOUS) ×4 IMPLANT
SCISSORS LAP 5X35 DISP (ENDOMECHANICALS) IMPLANT
SET IRRIG TUBING LAPAROSCOPIC (IRRIGATION / IRRIGATOR) ×4 IMPLANT
SOLUTION ANTI FOG 6CC (MISCELLANEOUS) ×4 IMPLANT
SOLUTION ELECTROLUBE (MISCELLANEOUS) ×4 IMPLANT
SPONGE GAUZE 4X4 12PLY (GAUZE/BANDAGES/DRESSINGS) ×4 IMPLANT
SUT VICRYL 0 UR6 27IN ABS (SUTURE) ×4 IMPLANT
SUT VICRYL RAPIDE 3 0 (SUTURE) ×4 IMPLANT
SYR CONTROL 10ML LL (SYRINGE) ×4 IMPLANT
TAPE PAPER 2X10 WHT MICROPORE (GAUZE/BANDAGES/DRESSINGS) ×4 IMPLANT
TOWEL OR 17X24 6PK STRL BLUE (TOWEL DISPOSABLE) ×8 IMPLANT
TRAY DSU PREP LF (CUSTOM PROCEDURE TRAY) IMPLANT
TROCAR OPTI TIP 5M 100M (ENDOMECHANICALS) ×8 IMPLANT
TROCAR XCEL DIL TIP R 11M (ENDOMECHANICALS) ×4 IMPLANT
TUBING INSUFFLATION W/FILTER (TUBING) ×4 IMPLANT
WARMER LAPAROSCOPE (MISCELLANEOUS) ×4 IMPLANT
WATER STERILE IRR 500ML POUR (IV SOLUTION) ×4 IMPLANT

## 2013-09-11 NOTE — Transfer of Care (Signed)
Immediate Anesthesia Transfer of Care Note  Patient: Jordan Velez  Procedure(s) Performed: Procedure(s) (LRB): LAPAROSCOPY DIAGNOSTIC , lysis of adhesions, ablation of endometrious (N/A) SALPINGO OOPHORECTOMY (Right)  Patient Location: PACU  Anesthesia Type: General  Level of Consciousness: awake, sedated, patient cooperative and responds to stimulation  Airway & Oxygen Therapy: Patient Spontanous Breathing and Patient connected to face mask oxygen  Post-op Assessment: Report given to PACU RN, Post -op Vital signs reviewed and stable and Patient moving all extremities  Post vital signs: Reviewed and stable  Complications: No apparent anesthesia complications

## 2013-09-11 NOTE — Anesthesia Postprocedure Evaluation (Signed)
  Anesthesia Post-op Note  Patient: Jordan Velez  Procedure(s) Performed: Procedure(s) (LRB): LAPAROSCOPY DIAGNOSTIC , lysis of adhesions, ablation of endometrious (N/A) SALPINGO OOPHORECTOMY (Right)  Patient Location: PACU  Anesthesia Type: General  Level of Consciousness: awake and alert   Airway and Oxygen Therapy: Patient Spontanous Breathing  Post-op Pain: mild  Post-op Assessment: Post-op Vital signs reviewed, Patient's Cardiovascular Status Stable, Respiratory Function Stable, Patent Airway and No signs of Nausea or vomiting  Last Vitals:  Filed Vitals:   09/11/13 1215  BP: 133/99  Pulse: 92  Temp:   Resp: 15    Post-op Vital Signs: stable   Complications: No apparent anesthesia complications

## 2013-09-11 NOTE — Procedures (Signed)
Preop Pelvic pain, endometriosis, recurrent ov cysts Post:  Same, plus pelvic adhesions Surgeon Monish [place] Anesthesia GET Findings mild endometriosis, pelvic adhesions, Right ov cyst Procedure:  Laprocopic RSO, LOA, ablation of Endometriosis No complications EBL min

## 2013-09-11 NOTE — Anesthesia Preprocedure Evaluation (Addendum)
Anesthesia Evaluation  Patient identified by MRN, date of birth, ID band Patient awake    Reviewed: Allergy & Precautions, H&P , NPO status , Patient's Chart, lab work & pertinent test results  Airway Mallampati: II TM Distance: >3 FB Neck ROM: Full    Dental no notable dental hx.    Pulmonary neg pulmonary ROS,  breath sounds clear to auscultation  Pulmonary exam normal       Cardiovascular hypertension, Pt. on medications negative cardio ROS  Rhythm:Regular Rate:Normal     Neuro/Psych negative neurological ROS  negative psych ROS   GI/Hepatic negative GI ROS, Neg liver ROS,   Endo/Other  negative endocrine ROS  Renal/GU negative Renal ROS  negative genitourinary   Musculoskeletal negative musculoskeletal ROS (+)   Abdominal   Peds negative pediatric ROS (+)  Hematology negative hematology ROS (+)   Anesthesia Other Findings   Reproductive/Obstetrics negative OB ROS                           Anesthesia Physical Anesthesia Plan  ASA: II  Anesthesia Plan: General   Post-op Pain Management:    Induction: Intravenous  Airway Management Planned: Oral ETT  Additional Equipment:   Intra-op Plan:   Post-operative Plan: Extubation in OR  Informed Consent: I have reviewed the patients History and Physical, chart, labs and discussed the procedure including the risks, benefits and alternatives for the proposed anesthesia with the patient or authorized representative who has indicated his/her understanding and acceptance.   Dental advisory given  Plan Discussed with: CRNA  Anesthesia Plan Comments:         Anesthesia Quick Evaluation  

## 2013-09-11 NOTE — Anesthesia Procedure Notes (Signed)
Procedure Name: Intubation Date/Time: 09/11/2013 9:02 AM Performed by: Jessica Priest Pre-anesthesia Checklist: Patient identified, Emergency Drugs available, Suction available and Patient being monitored Patient Re-evaluated:Patient Re-evaluated prior to inductionOxygen Delivery Method: Circle System Utilized Preoxygenation: Pre-oxygenation with 100% oxygen Intubation Type: IV induction Ventilation: Mask ventilation without difficulty Laryngoscope Size: Mac and 4 Grade View: Grade II Tube type: Oral Tube size: 8.0 mm Number of attempts: 1 Airway Equipment and Method: stylet and oral airway Placement Confirmation: ETT inserted through vocal cords under direct vision,  positive ETCO2 and breath sounds checked- equal and bilateral Secured at: 22 cm Tube secured with: Tape Dental Injury: Teeth and Oropharynx as per pre-operative assessment  Comments: Noted open acne sores along facial area, dry chapped lips preop

## 2013-09-14 ENCOUNTER — Encounter (HOSPITAL_BASED_OUTPATIENT_CLINIC_OR_DEPARTMENT_OTHER): Payer: Self-pay | Admitting: Obstetrics and Gynecology

## 2013-09-14 NOTE — Op Note (Signed)
NAME:  Jordan Velez, Jordan Velez                ACCOUNT NO.:  [id number]  MEDICAL RECORD NO.:  [id number]  LOCATION:                                 FACILITY:  PHYSICIAN:  Dineen Kid. Rana Snare, M.D.    DATE OF BIRTH:  30-Jul-1971  DATE OF PROCEDURE:  09/11/2013 DATE OF DISCHARGE:                              OPERATIVE REPORT   PREOPERATIVE DIAGNOSES:  Pelvic pain, history of recurrent ovarian cyst, history of endometriosis.  POSTOPERATIVE DIAGNOSES:  Pelvic pain, history of recurrent ovarian cyst, history of endometriosis, plus pelvic adhesions, and right ovarian cyst.  PROCEDURE:  Laparoscopy with lysis of adhesions, ablation of endometriosis, and right salpingo-oophorectomy.  INDICATIONS:  Ms. Stamos has previously had a tubal ligation and NovaSure endometrial ablation.  She has a history of recurrent ovarian cyst, both on the right and left side.  At this time, she desires a more definitive surgical intervention.  She requests removal of one of the ovaries, which appears to be the worst.  She was staying towards the right side removed first then the left side because she tended to have more trouble on that this year.  She would like to preserve 1 ovary because she is not ready to go to menopause yet.  She also desires surgical evaluation and treatment of endometriosis.  We discussed the procedure at length, its risks, its benefits, its pros and cons, in fact it may not alleviate the pain recur or worsen.  Risks associated with surgery include, but not limited to, risk of infection, bleeding, damage to uterus, tubes, ovary, bowel, bladder, risk associated with anesthesia.  She does give her informed consent and wishes to proceed.  FINDINGS:  At the time of surgery normal-appearing liver.  I was unable to see the appendix.  Uterus appears to be normal.  The left tubo- ovarian complex was densely adhered to the pelvic sidewall.  The right ovary has a proximally 4-5 cm ovarian cyst and is also  densely adhered to the right ovarian sidewall.  There are several small endometriosis implants at the uterosacral ligaments underneath the uterus, the bladder appears to be normal.  DESCRIPTION OF PROCEDURE:  After adequate analgesia, the patient was placed in dorsal lithotomy position.  She was sterilely prepped and draped.  Bladder sterilely drained.  Graves speculum was placed.  A tenaculum was placed on the cervix.  A 1 cm infraumbilical skin incision was made.  Veress needle was inserted.  The abdomen was insufflated with dullness to percussion.  An 11 mm trocar was inserted, the above findings were noted.  A 5 mm trocar was inserted left of the midline 2 fingerbreadths above the pubic symphysis.  After careful and systematic evaluation of the abdomen and pelvis, it appeared that the left ovary appeared to be in the best shape, so we proceeded to grasp the right tubo-ovarian complex and with gentle countertraction using Gyrus cutting forceps, we were able to dissect along the pelvic sidewall and along the upper plica that adhered to adapt the infundibulopelvic ligaments, which was ligated and dissected across with Gyrus cutting forceps.  Similarly it was dissected up to the round ligament, which was transected and the right  tubo-ovarian complex with the right ovary was removed with good hemostasis achieved.  Care was taken to have good hemostasis, but no obvious injury to underlying bowel or ureter was identified.  The ovary was gently morcellated and removed through the umbilical incision. Copious amount of irrigation was applied to that area, and good hemostasis was achieved.  The left tubo-ovarian complex was grasped and gently dissected off the pelvic sidewall freeing it up.  Care was taken to avoid the underlying ureter, and achieve good hemostasis.  The left ovary appeared to have good blood supply.  Tubal fimbriated remnants were still attached to the ovary.  After the lysis  of adhesions, good movement of the tube and ovary were noted.  The endometriosis implants in the cul-de-sac and in the uterosacral ligaments were ablated with bipolar cautery with good desiccation noted.  No obvious remnants noted. After copious amount of irrigation, adequate hemostasis was assured. Reexamination of the dissection did not show any signs of obvious injury and good hemostasis, and ureters were identified bilaterally well away from the bladder dissection.  Interceed adhesion barrier was placed over these areas.  The abdomen was then desufflated.  Trocars were removed. The infraumbilical skin incision was closed with 0-Vicryl interrupted suture in the fascia, 3-0 Vicryl Rapide subcuticular suture, 5 mm site was closed with a 3-0 Vicryl Rapide subcuticular suture and Dermabond. The incisions were injected with 0.25% Marcaine, total 10 mL used.  The tenaculum removed from the cervix, noted to be hemostatic.  The patient was transferred to the recovery room in a stable condition.  Sponge, needle count was normal x3.  Estimated blood loss minimal.  The patient received 2 g of cefotetan preoperatively.  DISPOSITION:  The patient will be discharged home and will follow up in the office in 2-3 weeks.  Sent her with routine instruction sheet for laparoscopy or to return for increased pain, fever, or bleeding.  She is sent with a prescription for Percocet 5/325.  She is on chronic hydrocodone and does have a tolerance, she was given 30 of these.  Also gave a prescription on Lotrimin cream that apply on the intertriginous area that she has present.  Told to return for increased pain, fever, or bleeding.     Dineen Kid Rana Snare, M.D.     DCL/MEDQ  D:  09/11/2013  T:  09/12/2013  Job:  [id number]

## 2013-09-19 ENCOUNTER — Encounter (HOSPITAL_COMMUNITY): Payer: Self-pay

## 2013-09-19 ENCOUNTER — Inpatient Hospital Stay (HOSPITAL_COMMUNITY)
Admission: AD | Admit: 2013-09-19 | Discharge: 2013-09-19 | Disposition: A | Discharge status: Home / Self Care | Payer: 59 | Source: Ambulatory Visit | Attending: Obstetrics and Gynecology | Admitting: Obstetrics and Gynecology

## 2013-09-19 ENCOUNTER — Inpatient Hospital Stay (HOSPITAL_COMMUNITY): Payer: 59

## 2013-09-19 DIAGNOSIS — G8929 Other chronic pain: Secondary | ICD-10-CM | POA: Insufficient documentation

## 2013-09-19 DIAGNOSIS — N949 Unspecified condition associated with female genital organs and menstrual cycle: Secondary | ICD-10-CM | POA: Insufficient documentation

## 2013-09-19 DIAGNOSIS — R1033 Periumbilical pain: Secondary | ICD-10-CM | POA: Insufficient documentation

## 2013-09-19 DIAGNOSIS — G8918 Other acute postprocedural pain: Secondary | ICD-10-CM | POA: Insufficient documentation

## 2013-09-19 DIAGNOSIS — K7689 Other specified diseases of liver: Secondary | ICD-10-CM | POA: Insufficient documentation

## 2013-09-19 LAB — URINE MICROSCOPIC-ADD ON

## 2013-09-19 LAB — COMPREHENSIVE METABOLIC PANEL
ALT: 75 U/L — ABNORMAL HIGH (ref 0–35)
AST: 50 U/L — ABNORMAL HIGH (ref 0–37)
Albumin: 3.6 g/dL (ref 3.5–5.2)
Alkaline Phosphatase: 70 U/L (ref 39–117)
CO2: 24 mEq/L (ref 19–32)
Calcium: 9.5 mg/dL (ref 8.4–10.5)
Creatinine, Ser: 0.58 mg/dL (ref 0.50–1.10)
GFR calc Af Amer: >90 mL/min (ref >90)
Glucose, Bld: 107 mg/dL — ABNORMAL HIGH (ref 70–99)
Potassium: 4.4 mEq/L (ref 3.5–5.1)
Sodium: 133 mEq/L — ABNORMAL LOW (ref 135–145)
Total Protein: 6.8 g/dL (ref 6.0–8.3)

## 2013-09-19 LAB — CBC
Hemoglobin: 9.8 g/dL — ABNORMAL LOW (ref 12.0–15.0)
MCH: 25.6 pg — ABNORMAL LOW (ref 26.0–34.0)
MCHC: 32.3 g/dL (ref 30.0–36.0)
Platelets: 407 10*3/uL — ABNORMAL HIGH (ref 150–400)
RBC: 3.83 MIL/uL — ABNORMAL LOW (ref 3.87–5.11)
RDW: 15.2 % (ref 11.5–15.5)

## 2013-09-19 LAB — URINALYSIS, ROUTINE W REFLEX MICROSCOPIC
Ketones, ur: NEGATIVE mg/dL
Nitrite: POSITIVE — AB
Specific Gravity, Urine: <1.005 — ABNORMAL LOW (ref 1.005–1.030)
Urobilinogen, UA: 0.2 mg/dL (ref 0.0–1.0)
pH: 6.5 (ref 5.0–8.0)

## 2013-09-19 MED ORDER — MORPHINE SULFATE 4 MG/ML IJ SOLN
3.0000 mg | INTRAMUSCULAR | Status: DC | PRN
  Administered 2013-09-19: 3 mg via INTRAVENOUS
  Filled 2013-09-19: qty 1

## 2013-09-19 MED ORDER — DEXTROSE 5 % IV SOLN
2.0000 g | Freq: Once | INTRAVENOUS | Status: AC
  Administered 2013-09-19: 2 g via INTRAVENOUS
  Filled 2013-09-19: qty 2

## 2013-09-19 MED ORDER — NITROFURANTOIN MONOHYD MACRO 100 MG PO CAPS: 100.0000 mg | ORAL_CAPSULE | Freq: Two times a day (BID) | ORAL | Status: DC

## 2013-09-19 MED ORDER — LACTATED RINGERS IV SOLN
INTRAVENOUS | Status: DC
  Administered 2013-09-19: 13:00:00 via INTRAVENOUS

## 2013-09-19 MED ORDER — IOHEXOL 300 MG/ML  SOLN
100.0000 mL | Freq: Once | INTRAMUSCULAR | Status: AC | PRN
  Administered 2013-09-19: 100 mL via INTRAVENOUS

## 2013-09-19 MED ORDER — IOHEXOL 300 MG/ML  SOLN
50.0000 mL | INTRAMUSCULAR | Status: AC
  Administered 2013-09-19 (×2): 50 mL via ORAL

## 2013-09-19 MED ORDER — LIDOCAINE 5 % EX PTCH
1.0000 | MEDICATED_PATCH | CUTANEOUS | Status: DC
  Administered 2013-09-19: 1 via TRANSDERMAL
  Filled 2013-09-19: qty 1

## 2013-09-19 MED ORDER — SODIUM CHLORIDE 0.9 % IV SOLN
INTRAVENOUS | Status: DC
  Administered 2013-09-19: 14:00:00 via INTRAVENOUS

## 2013-09-19 MED ORDER — KETOROLAC TROMETHAMINE 30 MG/ML IJ SOLN
30.0000 mg | Freq: Once | INTRAMUSCULAR | Status: AC
  Administered 2013-09-19: 30 mg via INTRAVENOUS
  Filled 2013-09-19: qty 1

## 2013-09-19 NOTE — Progress Notes (Signed)
CT C/W post op changes and diffuse hepatic steatosis, no obstruction or hernia  Results for orders placed during the hospital encounter of 09/19/13 (from the past 24 hour(s))  URINALYSIS, ROUTINE W REFLEX MICROSCOPIC     Status: Abnormal   Collection Time    09/19/13 11:25 AM      Result Value Range   Color, Urine YELLOW  YELLOW   APPearance HAZY (*) CLEAR   Specific Gravity, Urine <1.005 (*) 1.005 - 1.030   pH 6.5  5.0 - 8.0   Glucose, UA NEGATIVE  NEGATIVE mg/dL   Hgb urine dipstick TRACE (*) NEGATIVE   Bilirubin Urine NEGATIVE  NEGATIVE   Ketones, ur NEGATIVE  NEGATIVE mg/dL   Protein, ur NEGATIVE  NEGATIVE mg/dL   Urobilinogen, UA 0.2  0.0 - 1.0 mg/dL   Nitrite POSITIVE (*) NEGATIVE   Leukocytes, UA SMALL (*) NEGATIVE  URINE MICROSCOPIC-ADD ON     Status: Abnormal   Collection Time    09/19/13 11:25 AM      Result Value Range   Squamous Epithelial / LPF FEW (*) RARE   WBC, UA 7-10  <3 WBC/hpf   RBC / HPF 0-2  <3 RBC/hpf   Bacteria, UA MANY (*) RARE  CBC     Status: Abnormal   Collection Time    09/19/13 12:55 PM      Result Value Range   WBC 9.9  4.0 - 10.5 K/uL   RBC 3.83 (*) 3.87 - 5.11 MIL/uL   Hemoglobin 9.8 (*) 12.0 - 15.0 g/dL   HCT 16.1 (*) 09.6 - 04.5 %   MCV 79.1  78.0 - 100.0 fL   MCH 25.6 (*) 26.0 - 34.0 pg   MCHC 32.3  30.0 - 36.0 g/dL   RDW 40.9  81.1 - 91.4 %   Platelets 407 (*) 150 - 400 K/uL  COMPREHENSIVE METABOLIC PANEL     Status: Abnormal   Collection Time    09/19/13 12:55 PM      Result Value Range   Sodium 133 (*) 135 - 145 mEq/L   Potassium 4.4  3.5 - 5.1 mEq/L   Chloride 98  96 - 112 mEq/L   CO2 24  19 - 32 mEq/L   Glucose, Bld 107 (*) 70 - 99 mg/dL   BUN 9  6 - 23 mg/dL   Creatinine, Ser 7.82  0.50 - 1.10 mg/dL   Calcium 9.5  8.4 - 95.6 mg/dL   Total Protein 6.8  6.0 - 8.3 g/dL   Albumin 3.6  3.5 - 5.2 g/dL   AST 50 (*) 0 - 37 U/L   ALT 75 (*) 0 - 35 U/L   Alkaline Phosphatase 70  39 - 117 U/L   Total Bilirubin 0.3  0.3 - 1.2  mg/dL   GFR calc non Af Amer >90  >90 mL/min   GFR calc Af Amer >90  >90 mL/min   S/P Ceftriaxone 2 gms IV   A: Periumbilical pain probably abdominal wall strain     Possible UTI  P: Lidoderm patch to RLQ      Continue with same post op pain meds      Macrobid x 1 week      Urine C&S sent      FU office as scheduled

## 2013-09-19 NOTE — Progress Notes (Signed)
42 yo S/P BTL, S/P L./S with LOA and RSO with Dr Rana Snare on 09/12/13 C/O periumbilical pain starting about 3-4 days ago. Pain traveled around umbilicus and is now inferior to it. Pain occurs mostly with flexion of abdominal wall with getting up, etc. Already has daily reflux and mild nausea on/off that is unchanged. Bowels moving although hard and about every 2 days. No fever. 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 Does not leave house because of pain with traveling in car. Takes hydrocodone 4 times / day. Has been taking oxycodone in addition for post op pain.  PE: VSS Afeb Pt in NAD, smiling Lungs CTA Cor RRR Abd soft, obese        Infraumbilical incision-skin edges well opposed, no induration, no erythema, no purulence. I cannot palpate incisional hernia with abdominal flexion. There is diastasis above level of umbilicus. No masses.  LE NT without cords  A: Periumbilical pain after laparoscopy-R/O hernia     Chronic pain  P: IV fluids     CBC, CMET     IV toradol, IV morphine prn     Will get Abd/Pelvic CT     If CT OK, will consider lidocaine patch     D/W patient above, she agrees, all questions answered

## 2013-09-19 NOTE — MAU Note (Signed)
Pt presents complaining of abdominal pain right below the umbilicus following her gynecological surgery last Friday 12/19. Denies vaginal bleeding and discharge.

## 2013-09-21 LAB — URINE CULTURE

## 2014-01-06 ENCOUNTER — Emergency Department (HOSPITAL_COMMUNITY)
Admission: EM | Admit: 2014-01-06 | Discharge: 2014-01-06 | Disposition: A | Discharge status: Home / Self Care | Payer: Medicare HMO | Attending: Emergency Medicine | Admitting: Emergency Medicine

## 2014-01-06 ENCOUNTER — Encounter (HOSPITAL_COMMUNITY): Payer: Self-pay | Admitting: Emergency Medicine

## 2014-01-06 ENCOUNTER — Emergency Department (HOSPITAL_COMMUNITY): Payer: Medicare HMO

## 2014-01-06 DIAGNOSIS — R1013 Epigastric pain: Secondary | ICD-10-CM | POA: Insufficient documentation

## 2014-01-06 DIAGNOSIS — G8929 Other chronic pain: Secondary | ICD-10-CM | POA: Insufficient documentation

## 2014-01-06 DIAGNOSIS — R109 Unspecified abdominal pain: Secondary | ICD-10-CM

## 2014-01-06 DIAGNOSIS — R112 Nausea with vomiting, unspecified: Secondary | ICD-10-CM | POA: Insufficient documentation

## 2014-01-06 DIAGNOSIS — Z9884 Bariatric surgery status: Secondary | ICD-10-CM | POA: Insufficient documentation

## 2014-01-06 DIAGNOSIS — Z79899 Other long term (current) drug therapy: Secondary | ICD-10-CM | POA: Insufficient documentation

## 2014-01-06 DIAGNOSIS — Z8679 Personal history of other diseases of the circulatory system: Secondary | ICD-10-CM | POA: Insufficient documentation

## 2014-01-06 DIAGNOSIS — Z87448 Personal history of other diseases of urinary system: Secondary | ICD-10-CM | POA: Insufficient documentation

## 2014-01-06 DIAGNOSIS — R1012 Left upper quadrant pain: Secondary | ICD-10-CM | POA: Insufficient documentation

## 2014-01-06 DIAGNOSIS — I10 Essential (primary) hypertension: Secondary | ICD-10-CM | POA: Insufficient documentation

## 2014-01-06 DIAGNOSIS — K5900 Constipation, unspecified: Secondary | ICD-10-CM

## 2014-01-06 DIAGNOSIS — Z3202 Encounter for pregnancy test, result negative: Secondary | ICD-10-CM | POA: Insufficient documentation

## 2014-01-06 DIAGNOSIS — K219 Gastro-esophageal reflux disease without esophagitis: Secondary | ICD-10-CM

## 2014-01-06 DIAGNOSIS — Z8742 Personal history of other diseases of the female genital tract: Secondary | ICD-10-CM | POA: Insufficient documentation

## 2014-01-06 LAB — TROPONIN I

## 2014-01-06 LAB — PREGNANCY, URINE: PREG TEST UR: NEGATIVE

## 2014-01-06 LAB — URINALYSIS, ROUTINE W REFLEX MICROSCOPIC
Bilirubin Urine: NEGATIVE
GLUCOSE, UA: NEGATIVE mg/dL
Ketones, ur: NEGATIVE mg/dL
Nitrite: NEGATIVE
Protein, ur: NEGATIVE mg/dL
Specific Gravity, Urine: <1.005 — ABNORMAL LOW (ref 1.005–1.030)
Urobilinogen, UA: 0.2 mg/dL (ref 0.0–1.0)
pH: 5.5 (ref 5.0–8.0)

## 2014-01-06 LAB — HEPATIC FUNCTION PANEL
ALT: 15 U/L (ref 0–35)
AST: 16 U/L (ref 0–37)
Albumin: 4.1 g/dL (ref 3.5–5.2)
Alkaline Phosphatase: 61 U/L (ref 39–117)
TOTAL PROTEIN: 8.2 g/dL (ref 6.0–8.3)
Total Bilirubin: 0.3 mg/dL (ref 0.3–1.2)

## 2014-01-06 LAB — BASIC METABOLIC PANEL
BUN: 16 mg/dL (ref 6–23)
CHLORIDE: 95 meq/L — AB (ref 96–112)
CO2: 25 meq/L (ref 19–32)
Calcium: 9.9 mg/dL (ref 8.4–10.5)
Creatinine, Ser: 0.67 mg/dL (ref 0.50–1.10)
GFR calc Af Amer: >90 mL/min (ref >90)
GFR calc non Af Amer: >90 mL/min (ref >90)
Glucose, Bld: 143 mg/dL — ABNORMAL HIGH (ref 70–99)
Potassium: 3.5 mEq/L — ABNORMAL LOW (ref 3.7–5.3)
Sodium: 137 mEq/L (ref 137–147)

## 2014-01-06 LAB — LIPASE, BLOOD: Lipase: 22 U/L (ref 11–59)

## 2014-01-06 LAB — URINE MICROSCOPIC-ADD ON

## 2014-01-06 LAB — CBC
HCT: 37.6 % (ref 36.0–46.0)
Hemoglobin: 12.4 g/dL (ref 12.0–15.0)
MCH: 27.9 pg (ref 26.0–34.0)
MCHC: 33 g/dL (ref 30.0–36.0)
MCV: 84.5 fL (ref 78.0–100.0)
Platelets: 390 10*3/uL (ref 150–400)
RBC: 4.45 MIL/uL (ref 3.87–5.11)
RDW: 14.4 % (ref 11.5–15.5)
WBC: 10.3 10*3/uL (ref 4.0–10.5)

## 2014-01-06 MED ORDER — ONDANSETRON HCL 4 MG/2ML IJ SOLN
4.0000 mg | Freq: Once | INTRAMUSCULAR | Status: AC
  Administered 2014-01-06: 4 mg via INTRAMUSCULAR
  Filled 2014-01-06: qty 2

## 2014-01-06 MED ORDER — SODIUM CHLORIDE 0.9 % IV SOLN: 1000.0000 mL | INTRAVENOUS | Status: DC

## 2014-01-06 MED ORDER — ONDANSETRON HCL 4 MG PO TABS: 4.0000 mg | ORAL_TABLET | Freq: Three times a day (TID) | ORAL | Status: DC | PRN

## 2014-01-06 MED ORDER — OMEPRAZOLE 40 MG PO CPDR: DELAYED_RELEASE_CAPSULE | ORAL | Status: DC

## 2014-01-06 MED ORDER — GI COCKTAIL ~~LOC~~
30.0000 mL | Freq: Once | ORAL | Status: AC
  Administered 2014-01-06: 30 mL via ORAL
  Filled 2014-01-06: qty 30

## 2014-01-06 MED ORDER — SODIUM CHLORIDE 0.9 % IV SOLN
1000.0000 mL | Freq: Once | INTRAVENOUS | Status: AC
  Administered 2014-01-06: 1000 mL via INTRAVENOUS

## 2014-01-06 MED ORDER — FENTANYL CITRATE 0.05 MG/ML IJ SOLN
50.0000 ug | Freq: Once | INTRAMUSCULAR | Status: AC
  Administered 2014-01-06: 50 ug via INTRAVENOUS
  Filled 2014-01-06: qty 2

## 2014-01-06 MED ORDER — FAMOTIDINE IN NACL 20-0.9 MG/50ML-% IV SOLN
20.0000 mg | Freq: Once | INTRAVENOUS | Status: AC
  Administered 2014-01-06: 20 mg via INTRAVENOUS
  Filled 2014-01-06: qty 50

## 2014-01-06 NOTE — ED Notes (Signed)
MD Knapp at bedside 

## 2014-01-06 NOTE — Discharge Instructions (Signed)
You can take your pain medication that you have at home. STOP TAKING THE IBUPROFEN!! You may have an ulcer in your stomach or inflammation of your stomach or esophagus. Take the prilosec as prescribed. Use the zofran if needed for nausea or vomiting. Call Dr Gala Romney to discuss if you need to have an endoscopy to look at your esophagus and stomach. You can take miralax 1 dose every 30 minutes for 3-6 doses or until you get good results then daily while you are taking the iron and pain pills.

## 2014-01-06 NOTE — ED Notes (Signed)
Pt states abdomen, sides and chest started hurting yesterday.

## 2014-01-06 NOTE — ED Provider Notes (Signed)
CSN: [id number]     Arrival date & time 01/06/14  1652 History   First MD Initiated Contact with Patient 01/06/14 1710     Chief Complaint  Patient presents with  . Chest Pain     (Consider location/radiation/quality/duration/timing/severity/associated sxs/prior Treatment) HPI Patient has history of chronic pelvic pain with interstitial cystitis and endometriosis. She has had roux-en-y. surgery for weight loss. She reports yesterday about 3 PM she started having "weird pains". She states that pain that started in her back around her ribs and into her abdomen and then went up into her chest. She states her abdomen feels tight and bloated and normally it's soft and flabby because of her weight loss. She states the pain is sharp and a pressure-type pain. She also reports that she gets a burning in her throat when she has vomiting. She states she's been taking ibuprofen for "breakthrough pain"although she was told not to after she had her surgery. She's been taking it daily for about 6 months. She states she takes up to 8 tablets a day. She started having nausea and vomiting yesterday without blood. She states eating makes the pain feel worse. She does not see blood in the vomitus. She states sometimes she wakes up at night and she has a burning reflux fluid in her throat. She denies any rectal bleeding or diarrhea. Patient states she has chronic low back pain, interstitial cystitis and polycystic ovaries, endometriosis.  PCP Dr Barrie Folk GYN Dr Corinna Capra [facility]--[facility] Dr Amalia Hailey at [facility]  Past Medical History  Diagnosis Date  . Endometriosis   . Pelvic pain   . Ovarian cyst     RECURRENT  . IC (interstitial cystitis)   . Migraine   . Chronic back pain   . Chronic nausea     POST GASTRIC BYPASS  . S/P gastric bypass     2007  . Hypertension    Past Surgical History  Procedure Laterality Date  . Hysteroscopy w/d&c  02-10-2003  . Roux-en-y procedure  06-14-2003  . Cystourethroscopy/   hydodistention/ bladder bx  01-02-2006  . Endometrial ablation w/ novasure  2007  . Laparoscopy N/A 09/11/2013    Procedure: LAPAROSCOPY DIAGNOSTIC , lysis of adhesions, ablation of endometrious;  Surgeon: Luz Lex, MD;  Location: [facility];  Service: Gynecology;  Laterality: N/A;  . Salpingoophorectomy Right 09/11/2013    Procedure: SALPINGO OOPHORECTOMY;  Surgeon: Luz Lex, MD;  Location: [facility];  Service: Gynecology;  Laterality: Right;   History reviewed. No pertinent family history. History  Substance Use Topics  . Smoking status: Never Smoker   . Smokeless tobacco: Never Used  . Alcohol Use: No   On disability for pelvic pain, depression Lives with spouse  OB History   Grav Para Term Preterm Abortions TAB SAB Ect Mult Living   1 1 1       1      Review of Systems  All other systems reviewed and are negative.     Allergies  Codeine and Nsaids  Home Medications   Prior to Admission medications   Medication Sig Start Date End Date Taking? Authorizing Provider  amitriptyline (ELAVIL) 150 MG tablet Take 75-150 mg by mouth at bedtime.    Yes Historical Provider, MD  clonazePAM (KLONOPIN) 1 MG tablet Take 1 mg by mouth 3 (three) times daily.   Yes Historical Provider, MD  clotrimazole-betamethasone (LOTRISONE) cream Apply 1 application topically 2 (two) times daily. Apply twice daily as  needed 09/11/13  Yes Luz Lex, MD  cyclobenzaprine (FLEXERIL) 10 MG tablet Take 10 mg by mouth 3 (three) times daily as needed for muscle spasms.   Yes Historical Provider, MD  HYDROcodone-acetaminophen (NORCO) 10-325 MG per tablet Take 1 tablet by mouth 4 (four) times daily.   Yes Historical Provider, MD  ibuprofen (ADVIL,MOTRIN) 200 MG tablet Take 200 mg by mouth daily as needed for mild pain or moderate pain.   Yes Historical Provider, MD  lisinopril-hydrochlorothiazide (PRINZIDE,ZESTORETIC) 20-25 MG per tablet Take 1 tablet by mouth every  morning.   Yes Historical Provider, MD  zolpidem (AMBIEN) 10 MG tablet Take 10 mg by mouth at bedtime as needed for sleep.    Yes Historical Provider, MD  PRESCRIPTION MEDICATION 23 mLs by Intracatheter route daily as needed (for bladder pain). Sodium Bicarb, Heparin, Lidocaine: combination inserted thru catheter into bladder.    Historical Provider, MD   BP 125/69  Pulse 96  Temp(Src) 97.5 F (36.4 C) (Oral)  Resp 19  Ht 5' 6.5" (1.689 m)  Wt 225 lb (102.059 kg)  BMI 35.78 kg/m2  SpO2 90%  Vital signs normal    Physical Exam  Nursing note and vitals reviewed. Constitutional: She is oriented to person, place, and time. She appears well-developed and well-nourished.  Non-toxic appearance. She does not appear ill. No distress.  HENT:  Head: Normocephalic and atraumatic.  Right Ear: External ear normal.  Left Ear: External ear normal.  Nose: Nose normal. No mucosal edema or rhinorrhea.  Mouth/Throat: Oropharynx is clear and moist and mucous membranes are normal. No dental abscesses or uvula swelling.  Eyes: Conjunctivae and EOM are normal. Pupils are equal, round, and reactive to light.  Neck: Normal range of motion and full passive range of motion without pain. Neck supple.  Cardiovascular: Normal rate, regular rhythm and normal heart sounds.  Exam reveals no gallop and no friction rub.   No murmur heard. Pulmonary/Chest: Effort normal and breath sounds normal. No respiratory distress. She has no wheezes. She has no rhonchi. She has no rales. She exhibits no tenderness and no crepitus.  Abdominal: Soft. Normal appearance and bowel sounds are normal. She exhibits no distension. There is tenderness in the epigastric area and left upper quadrant. There is no rebound and no guarding.  Musculoskeletal: Normal range of motion. She exhibits no edema and no tenderness.  Moves all extremities well.   Neurological: She is alert and oriented to person, place, and time. She has normal strength.  No cranial nerve deficit.  Skin: Skin is warm, dry and intact. No rash noted. No erythema. No pallor.  Psychiatric: She has a normal mood and affect. Her speech is normal and behavior is normal. Her mood appears not anxious.    ED Course  Procedures (including critical care time)  Medications  0.9 %  sodium chloride infusion (0 mLs Intravenous Stopped 01/06/14 2141)    Followed by  0.9 %  sodium chloride infusion (not administered)  famotidine (PEPCID) IVPB 20 mg (0 mg Intravenous Stopped 01/06/14 1939)  gi cocktail (Maalox,Lidocaine,Donnatal) (30 mLs Oral Given 01/06/14 1910)  ondansetron (ZOFRAN) injection 4 mg (4 mg Intramuscular Given 01/06/14 1908)  fentaNYL (SUBLIMAZE) injection 50 mcg (50 mcg Intravenous Given 01/06/14 1907)   Patient states her nausea is better. The burning discomfort is gone. She still has some abdominal discomfort. We discussed her test results. Husband states she also started getting intense dieting recently. She states she's eating protein shakes which she used to  do after her surgery. She also feels the iron pills she has been taking have made her constipated. She does not have a gastroenterologist. We discussed doing a CT scan of her abdomen tonight however she does not want to do that. She did have a CT scan in December which showed her bypass surgery was intact. Labs Review Results for orders placed during the hospital encounter of 01/06/14  CBC      Result Value Ref Range   WBC 10.3  4.0 - 10.5 K/uL   RBC 4.45  3.87 - 5.11 MIL/uL   Hemoglobin 12.4  12.0 - 15.0 g/dL   HCT 37.6  36.0 - 46.0 %   MCV 84.5  78.0 - 100.0 fL   MCH 27.9  26.0 - 34.0 pg   MCHC 33.0  30.0 - 36.0 g/dL   RDW 14.4  11.5 - 15.5 %   Platelets 390  150 - 400 K/uL  BASIC METABOLIC PANEL      Result Value Ref Range   Sodium 137  137 - 147 mEq/L   Potassium 3.5 (*) 3.7 - 5.3 mEq/L   Chloride 95 (*) 96 - 112 mEq/L   CO2 25  19 - 32 mEq/L   Glucose, Bld 143 (*) 70 - 99 mg/dL   BUN 16  6  - 23 mg/dL   Creatinine, Ser 0.67  0.50 - 1.10 mg/dL   Calcium 9.9  8.4 - 10.5 mg/dL   GFR calc non Af Amer >90  >90 mL/min   GFR calc Af Amer >90  >90 mL/min  URINALYSIS, ROUTINE W REFLEX MICROSCOPIC      Result Value Ref Range   Color, Urine YELLOW  YELLOW   APPearance CLEAR  CLEAR   Specific Gravity, Urine <1.005 (*) 1.005 - 1.030   pH 5.5  5.0 - 8.0   Glucose, UA NEGATIVE  NEGATIVE mg/dL   Hgb urine dipstick TRACE (*) NEGATIVE   Bilirubin Urine NEGATIVE  NEGATIVE   Ketones, ur NEGATIVE  NEGATIVE mg/dL   Protein, ur NEGATIVE  NEGATIVE mg/dL   Urobilinogen, UA 0.2  0.0 - 1.0 mg/dL   Nitrite NEGATIVE  NEGATIVE   Leukocytes, UA TRACE (*) NEGATIVE  TROPONIN I      Result Value Ref Range   Troponin I <0.30  <0.30 ng/mL  HEPATIC FUNCTION PANEL      Result Value Ref Range   Total Protein 8.2  6.0 - 8.3 g/dL   Albumin 4.1  3.5 - 5.2 g/dL   AST 16  0 - 37 U/L   ALT 15  0 - 35 U/L   Alkaline Phosphatase 61  39 - 117 U/L   Total Bilirubin 0.3  0.3 - 1.2 mg/dL   Bilirubin, Direct <0.2  0.0 - 0.3 mg/dL   Indirect Bilirubin NOT CALCULATED  0.3 - 0.9 mg/dL  LIPASE, BLOOD      Result Value Ref Range   Lipase 22  11 - 59 U/L  PREGNANCY, URINE      Result Value Ref Range   Preg Test, Ur NEGATIVE  NEGATIVE  URINE MICROSCOPIC-ADD ON      Result Value Ref Range   Squamous Epithelial / LPF FEW (*) RARE   WBC, UA 0-2  <3 WBC/hpf   RBC / HPF 0-2  <3 RBC/hpf   Bacteria, UA MANY (*) RARE   Laboratory interpretation all normal except mild hypokalemia    Imaging Review Dg Chest 2 View  01/06/2014   CLINICAL DATA:  Chest pain.  EXAM: CHEST  2 VIEW  COMPARISON:  03/23/2011.  FINDINGS: The cardiac silhouette, mediastinal and hilar contours are within normal limits and stable. The lungs are clear. The bony thorax is intact.  IMPRESSION: No acute cardiopulmonary findings. No change since prior chest film.   Electronically Signed   By: Kalman Jewels M.D.   On: 01/06/2014 18:07   Dg Abd 2  Views  01/06/2014   CLINICAL DATA:  Chest pain, abdominal pain  EXAM: ABDOMEN - 2 VIEW  COMPARISON:  None.  FINDINGS: There is no bowel dilatation to suggest obstruction. There are no air-fluid levels. A moderate amount of stool throughout the colon. There are surgical clips in the epigastric region. There is no evidence of pneumoperitoneum, portal venous gas, or pneumatosis. There are no pathologic calcifications along the expected course of the ureters.  The osseous structures are unremarkable. There is a bladder stimulator present.  IMPRESSION: Negative.   Electronically Signed   By: Kathreen Devoid   On: 01/06/2014 21:26     EKG Interpretation   Date/Time:  Wednesday January 06 2014 17:03:46 EDT Ventricular Rate:  81 PR Interval:  164 QRS Duration: 80 QT Interval:  378 QTC Calculation: 439 R Axis:   46 Text Interpretation:  Normal sinus rhythm Normal ECG When compared with  ECG of 11-Sep-2013 07:08, Nonspecific T wave abnormality has replaced  inverted T waves in Inferior leads Confirmed by Math Brazie  MD-I, Chelcee Korpi ([id number])  on 01/06/2014 5:19:21 PM      MDM   Final diagnoses:  Abdominal pain  GERD (gastroesophageal reflux disease)  Constipation    New Prescriptions   OMEPRAZOLE (PRILOSEC) 40 MG CAPSULE    Take 1 po BID x 2 weeks then once a day   ONDANSETRON (ZOFRAN) 4 MG TABLET    Take 1 tablet (4 mg total) by mouth every 8 (eight) hours as needed for nausea or vomiting.    Plan discharge  Rolland Porter, MD, Alanson Aly, MD 01/06/14 2159

## 2014-02-11 ENCOUNTER — Encounter (INDEPENDENT_AMBULATORY_CARE_PROVIDER_SITE_OTHER): Payer: Self-pay

## 2014-02-11 ENCOUNTER — Ambulatory Visit (INDEPENDENT_AMBULATORY_CARE_PROVIDER_SITE_OTHER): Payer: Medicare HMO | Admitting: Gastroenterology

## 2014-02-11 ENCOUNTER — Encounter: Payer: Self-pay | Admitting: Gastroenterology

## 2014-02-11 ENCOUNTER — Telehealth: Payer: Self-pay

## 2014-02-11 ENCOUNTER — Other Ambulatory Visit: Payer: Self-pay | Admitting: Internal Medicine

## 2014-02-11 VITALS — BP 108/73 | HR 102 | Temp 98.4°F | Ht 66.0 in | Wt 221.4 lb

## 2014-02-11 DIAGNOSIS — K760 Fatty (change of) liver, not elsewhere classified: Secondary | ICD-10-CM | POA: Insufficient documentation

## 2014-02-11 DIAGNOSIS — R109 Unspecified abdominal pain: Secondary | ICD-10-CM

## 2014-02-11 DIAGNOSIS — K5900 Constipation, unspecified: Secondary | ICD-10-CM

## 2014-02-11 DIAGNOSIS — K7689 Other specified diseases of liver: Secondary | ICD-10-CM

## 2014-02-11 MED ORDER — LINACLOTIDE 145 MCG PO CAPS: 145.0000 ug | ORAL_CAPSULE | Freq: Every day | ORAL | Status: DC

## 2014-02-11 MED ORDER — SUCRALFATE 1 GM/10ML PO SUSP
1.0000 g | Freq: Four times a day (QID) | ORAL | Status: DC
Start: 2014-02-11 — End: 2014-02-18

## 2014-02-11 MED ORDER — OXYCODONE HCL 10 MG PO TABS: 10.0000 mg | ORAL_TABLET | Freq: Four times a day (QID) | ORAL | Status: DC

## 2014-02-11 MED ORDER — LANSOPRAZOLE 30 MG PO TBDP: 30.0000 mg | ORAL_TABLET | Freq: Every day | ORAL | Status: DC

## 2014-02-11 NOTE — Telephone Encounter (Signed)
Pt is calling about the Carafate liquid. She stated that she could not afford it and would like to know if she could do the tablets instead. But she wanted to make sure which one was gong to be better for her.

## 2014-02-11 NOTE — Telephone Encounter (Signed)
I spoke with the pharmacist. We will do the tablets and have them dissolve in water prior to taking. I have phoned in the new prescription.

## 2014-02-11 NOTE — Assessment & Plan Note (Signed)
43 year old female with remote history of laparoscopic gastric bypass, presenting with severe epigastric pain associated with eating in the setting of significant Ibuprofen use. High concern for anastomotic ulceration secondary to NSAIDs, which she readily admits she knew not to take. LFTs and CBC unrevealing. Last CT on file from Dec 2014 without acute findings, fatty liver noted. She needs an EGD in near future for further evaluation. I have also asked her to call me immediately if she has persistent or worsening pain, and we would proceed with a stat CT to evaluate for an internal hernia; however, I feel this scenario is less likely, as she does not have any unexplained weight loss or classic symptoms associated with an internal hernia.   Proceed with upper endoscopy in the near future with Dr. Gala Romney. The risks, benefits, and alternatives have been discussed in detail with patient. They have stated understanding and desire to proceed.  Phenergan 25 mg IV on call due to polypharmacy Prevacid 30 mg po BID Carafate QID Boost/Ensure or similar protein drink has been recommended, along with soft foods. Avoid tough textures for now.  Completely avoid NSAIDs CT stat IF worsening of pain or no improvement despite PPI BID, Carafate, and dietary modification

## 2014-02-11 NOTE — Progress Notes (Signed)
Primary Care Physician:  REDMON,NOELLE, PA-C Primary Gastroenterologist:  Dr.   Laurel Dimmer Complaint  Patient presents with  . Abdominal Pain    HPI:   Jordan Velez presents today at the request of [facility] ED secondary to abdominal pain.Remote hx of IBS.  Laparoscopic Roux-en-Y gastric bypass in 2004. April 15th started having a violent pain left side of back, radiating around to LUQ and up to epigastric region, precipitated by eating. Was curled up in fetal position at hospital but went away 3 hours later. Now eating precipitates severe abdominal pain. N/V is chronic but worsening since April 15th. Eating extremely small portions due to pain. No melena. Was taking Ibuprofen multiple times a day. Still taking every once awhile due to chronic pain issues due to other comorbidities. Has hx of interstitial cystitis, chronic pain. Not as bad pain with soft foods. Denies dysphagia. Was given Prilosec BID. Doesn't think she is absorbing it.    No changes in medication. Constipation worsening. BM every other day.   Past Medical History  Diagnosis Date  . Endometriosis   . Pelvic pain   . Ovarian cyst     RECURRENT  . IC (interstitial cystitis)   . Migraine   . Chronic back pain   . Chronic nausea     POST GASTRIC BYPASS  . S/P gastric bypass     2004  . Hypertension     Past Surgical History  Procedure Laterality Date  . Hysteroscopy w/d&c  02-10-2003  . Roux-en-y procedure  06-14-2003  . Cystourethroscopy/  hydodistention/ bladder bx  01-02-2006  . Endometrial ablation w/ novasure  2007  . Laparoscopy N/A 09/11/2013    Procedure: LAPAROSCOPY DIAGNOSTIC , lysis of adhesions, ablation of endometrious;  Surgeon: Luz Lex, MD;  Location: [facility];  Service: Gynecology;  Laterality: N/A;  . Salpingoophorectomy Right 09/11/2013    Procedure: SALPINGO OOPHORECTOMY;  Surgeon: Luz Lex, MD;  Location: [facility];  Service: Gynecology;  Laterality:  Right;  . Tens unit      Current Outpatient Prescriptions  Medication Sig Dispense Refill  . amitriptyline (ELAVIL) 150 MG tablet Take 75-150 mg by mouth at bedtime.       . clonazePAM (KLONOPIN) 1 MG tablet Take 1 mg by mouth 3 (three) times daily.      . clotrimazole-betamethasone (LOTRISONE) cream Apply 1 application topically 2 (two) times daily. Apply twice daily as needed  30 g  0  . cyclobenzaprine (FLEXERIL) 10 MG tablet Take 10 mg by mouth 3 (three) times daily as needed for muscle spasms.      Marland Kitchen ibuprofen (ADVIL,MOTRIN) 200 MG tablet Take 200 mg by mouth daily as needed for mild pain or moderate pain.      Marland Kitchen lisinopril-hydrochlorothiazide (PRINZIDE,ZESTORETIC) 20-25 MG per tablet Take 1 tablet by mouth every morning.      . ondansetron (ZOFRAN) 4 MG tablet Take 1 tablet (4 mg total) by mouth every 8 (eight) hours as needed for nausea or vomiting.  12 tablet  0  . PRESCRIPTION MEDICATION 23 mLs by Intracatheter route daily as needed (for bladder pain). Sodium Bicarb, Heparin, Lidocaine: combination inserted thru catheter into bladder.      . sulfamethoxazole-trimethoprim (BACTRIM DS) 800-160 MG per tablet Take 1 tablet by mouth as needed.      . zolpidem (AMBIEN) 10 MG tablet Take 10 mg by mouth at bedtime as needed for sleep.       Marland Kitchen  lansoprazole (PREVACID SOLUTAB) 30 MG disintegrating tablet Take 1 tablet (30 mg total) by mouth daily.  60 tablet  3  . Linaclotide (LINZESS) 145 MCG CAPS capsule Take 1 capsule (145 mcg total) by mouth daily. Take 30 prior to breakfast  30 capsule  5  . Oxycodone HCl 10 MG TABS Take 1 tablet (10 mg total) by mouth every 6 (six) hours.  30 tablet  0  . sucralfate (CARAFATE) 1 GM/10ML suspension Take 10 mLs (1 g total) by mouth 4 (four) times daily.  420 mL  1   No current facility-administered medications for this visit.    Allergies as of 02/11/2014 - Review Complete 02/11/2014  Allergen Reaction Noted  . Codeine Itching 09/09/2013  . Nsaids   01/06/2014    Family History  Problem Relation Age of Onset  . Colon cancer Neg Hx     History   Social History  . Marital Status: Married    Spouse Name: N/A    Number of Children: N/A  . Years of Education: N/A   Occupational History  . Not on file.   Social History Main Topics  . Smoking status: Never Smoker   . Smokeless tobacco: Never Used  . Alcohol Use: No  . Drug Use: No  . Sexual Activity: Not on file   Other Topics Concern  . Not on file   Social History Narrative  . No narrative on file    Review of Systems: As mentioned in HPI.   Physical Exam: BP 108/73  Pulse 102  Temp(Src) 98.4 F (36.9 C) (Oral)  Ht 5\' 6"  (1.676 m)  Wt 221 lb 6.4 oz (100.426 kg)  BMI 35.75 kg/m2 General:   Alert and oriented. Pleasant and cooperative. Well-nourished and well-developed.  Head:  Normocephalic and atraumatic. Eyes:  Without icterus, sclera clear and conjunctiva pink.  Ears:  Normal auditory acuity. Nose:  No deformity, discharge,  or lesions. Mouth:  No deformity or lesions, oral mucosa pink.  Lungs:  Clear to auscultation bilaterally. No wheezes, rales, or rhonchi. No distress.  Heart:  S1, S2 present without murmurs appreciated.  Abdomen:  +BS, soft, non-tender and non-distended. No HSM noted. No guarding or rebound. No masses appreciated.  Rectal:  Deferred  Msk:  Symmetrical without gross deformities. Normal posture. Extremities:  Without clubbing or edema. Neurologic:  Alert and  oriented x4;  grossly normal neurologically. Skin:  Intact without significant lesions or rashes. Psych:  Alert and cooperative. Normal mood and affect.   Lab Results  Component Value Date   WBC 10.3 01/06/2014   HGB 12.4 01/06/2014   HCT 37.6 01/06/2014   MCV 84.5 01/06/2014   PLT 390 01/06/2014   Lab Results  Component Value Date   ALT 15 01/06/2014   AST 16 01/06/2014   ALKPHOS 61 01/06/2014   BILITOT 0.3 01/06/2014

## 2014-02-11 NOTE — Assessment & Plan Note (Signed)
Start Linzess 145 mcg daily. Voucher provided.  

## 2014-02-11 NOTE — Telephone Encounter (Signed)
Pt is aware.  

## 2014-02-11 NOTE — Assessment & Plan Note (Signed)
LFTs normal on file. Recheck every year.

## 2014-02-11 NOTE — Patient Instructions (Signed)
For your diet: Stick with protein shakes like boost, ensure, special K. Stay hydrated. You may have soft foods, broth, but avoid any tough textures. I have sent in Prevacid solutabs to your pharmacy to take twice a day. Take Carafate four times a day to help "coat" your stomach.  For constipation: Start Linzess 1 capsule each morning, 30 minutes before breakfast.   Abdominal pain: If this worsens, call me immediately. We may need to do a stat CT if you continue to have pain or worsening pain despite medication and diet changes. Regardless, we have set you up for an upper endoscopy with Dr. Gala Romney for further evaluation of your pouch and anastomosis.   STOP IBUPROFEN.

## 2014-02-16 ENCOUNTER — Telehealth: Payer: Self-pay | Admitting: Gastroenterology

## 2014-02-16 NOTE — Telephone Encounter (Signed)
Yes, needs stat CT now. Reason: abdominal pain s/p Roux-en-Y gastric bypass, evaluate for INTERNAL HERNIA.

## 2014-02-16 NOTE — Telephone Encounter (Signed)
Waiting for [facility] Approval

## 2014-02-16 NOTE — Telephone Encounter (Signed)
Abd/pelvis. Thanks!

## 2014-02-16 NOTE — Telephone Encounter (Signed)
Pt called back to see what they found out. I told her that they are wanting her to have a CT scan done but they are waiting on her insurance company to see if they will pay for it. Pt is aware of this and we will let her know when we know.

## 2014-02-16 NOTE — Telephone Encounter (Signed)
Vicente Males, per your ov note,do you want to do a CT?

## 2014-02-16 NOTE — Telephone Encounter (Signed)
CT of Abd or Abd/Pelvis please advise?

## 2014-02-16 NOTE — Telephone Encounter (Signed)
Did ok on diet for a few days, having a lot of back and abdominal pain, asking for another week on pain meds, procedure is scheduled for Tuesday June 2 please advise?

## 2014-02-18 ENCOUNTER — Encounter (HOSPITAL_COMMUNITY): Payer: Self-pay | Admitting: Pharmacy Technician

## 2014-02-18 ENCOUNTER — Other Ambulatory Visit: Payer: Self-pay | Admitting: Internal Medicine

## 2014-02-18 DIAGNOSIS — Z9884 Bariatric surgery status: Secondary | ICD-10-CM

## 2014-02-18 DIAGNOSIS — R109 Unspecified abdominal pain: Secondary | ICD-10-CM

## 2014-02-19 NOTE — Telephone Encounter (Signed)
Patient is scheduled for CT scan Tuesday June 2nd at 8:00, she needs to pick up her ready cat and I have tried to call but got not answer nor machine

## 2014-02-19 NOTE — Telephone Encounter (Signed)
Pt called back and she is aware to go by X-ray and pick up the ready cat. She is also aware to be there Tuesday for her CT scan.

## 2014-02-23 ENCOUNTER — Encounter (HOSPITAL_COMMUNITY): Payer: Self-pay

## 2014-02-23 ENCOUNTER — Ambulatory Visit (HOSPITAL_COMMUNITY)
Admission: RE | Admit: 2014-02-23 | Discharge: 2014-02-23 | Disposition: A | Discharge status: Home / Self Care | Payer: Medicare HMO | Source: Ambulatory Visit | Attending: Internal Medicine | Admitting: Internal Medicine

## 2014-02-23 ENCOUNTER — Encounter (HOSPITAL_COMMUNITY)
Admission: RE | Disposition: A | Discharge status: Home / Self Care | Payer: Self-pay | Source: Ambulatory Visit | Attending: Internal Medicine

## 2014-02-23 ENCOUNTER — Ambulatory Visit (HOSPITAL_COMMUNITY): Payer: Medicare HMO

## 2014-02-23 DIAGNOSIS — Z9884 Bariatric surgery status: Secondary | ICD-10-CM | POA: Insufficient documentation

## 2014-02-23 DIAGNOSIS — M545 Low back pain, unspecified: Secondary | ICD-10-CM | POA: Insufficient documentation

## 2014-02-23 DIAGNOSIS — R1013 Epigastric pain: Secondary | ICD-10-CM | POA: Insufficient documentation

## 2014-02-23 DIAGNOSIS — Z79899 Other long term (current) drug therapy: Secondary | ICD-10-CM | POA: Insufficient documentation

## 2014-02-23 DIAGNOSIS — K5900 Constipation, unspecified: Secondary | ICD-10-CM | POA: Insufficient documentation

## 2014-02-23 DIAGNOSIS — R109 Unspecified abdominal pain: Secondary | ICD-10-CM

## 2014-02-23 DIAGNOSIS — I10 Essential (primary) hypertension: Secondary | ICD-10-CM | POA: Insufficient documentation

## 2014-02-23 DIAGNOSIS — R1012 Left upper quadrant pain: Secondary | ICD-10-CM | POA: Insufficient documentation

## 2014-02-23 HISTORY — PX: ESOPHAGOGASTRODUODENOSCOPY: SHX5428

## 2014-02-23 SURGERY — EGD (ESOPHAGOGASTRODUODENOSCOPY)
Anesthesia: Moderate Sedation

## 2014-02-23 MED ORDER — ONDANSETRON HCL 4 MG/2ML IJ SOLN
INTRAMUSCULAR | Status: AC
  Filled 2014-02-23: qty 2

## 2014-02-23 MED ORDER — SODIUM CHLORIDE 0.9 % IV SOLN
INTRAVENOUS | Status: DC
  Administered 2014-02-23: 13:00:00 via INTRAVENOUS

## 2014-02-23 MED ORDER — PROMETHAZINE HCL 25 MG/ML IJ SOLN
INTRAMUSCULAR | Status: AC
  Filled 2014-02-23: qty 1

## 2014-02-23 MED ORDER — MIDAZOLAM HCL 5 MG/5ML IJ SOLN
INTRAMUSCULAR | Status: DC | PRN
  Administered 2014-02-23 (×7): 2 mg via INTRAVENOUS

## 2014-02-23 MED ORDER — MIDAZOLAM HCL 5 MG/5ML IJ SOLN
INTRAMUSCULAR | Status: AC
  Filled 2014-02-23: qty 5

## 2014-02-23 MED ORDER — SODIUM CHLORIDE 0.9 % IJ SOLN
INTRAMUSCULAR | Status: AC
  Filled 2014-02-23: qty 10

## 2014-02-23 MED ORDER — ONDANSETRON HCL 4 MG/2ML IJ SOLN
INTRAMUSCULAR | Status: DC | PRN
  Administered 2014-02-23: 4 mg via INTRAVENOUS

## 2014-02-23 MED ORDER — PROMETHAZINE HCL 25 MG/ML IJ SOLN
25.0000 mg | Freq: Once | INTRAMUSCULAR | Status: AC
  Administered 2014-02-23: 25 mg via INTRAVENOUS

## 2014-02-23 MED ORDER — MEPERIDINE HCL 100 MG/ML IJ SOLN
INTRAMUSCULAR | Status: AC
  Filled 2014-02-23: qty 2

## 2014-02-23 MED ORDER — STERILE WATER FOR IRRIGATION IR SOLN
Status: DC | PRN
  Administered 2014-02-23: 14:00:00

## 2014-02-23 MED ORDER — MIDAZOLAM HCL 5 MG/5ML IJ SOLN
INTRAMUSCULAR | Status: AC
  Filled 2014-02-23: qty 10

## 2014-02-23 MED ORDER — MEPERIDINE HCL 100 MG/ML IJ SOLN
INTRAMUSCULAR | Status: DC | PRN
  Administered 2014-02-23 (×2): 25 mg via INTRAVENOUS
  Administered 2014-02-23 (×3): 50 mg via INTRAVENOUS

## 2014-02-23 MED ORDER — LIDOCAINE VISCOUS 2 % MT SOLN
OROMUCOSAL | Status: DC | PRN
  Administered 2014-02-23: 3 mL via OROMUCOSAL

## 2014-02-23 MED ORDER — LIDOCAINE VISCOUS 2 % MT SOLN
OROMUCOSAL | Status: AC
  Filled 2014-02-23: qty 15

## 2014-02-23 NOTE — Op Note (Signed)
[place], [id number]   ENDOSCOPY PROCEDURE REPORT  PATIENT: Jordan Velez, Jordan Velez.  MR#: [id number] BIRTHDATE: 1971/04/24 , 42  yrs. old GENDER: Female ENDOSCOPIST: R.  Garfield Cornea, MD FACP FACG REFERRED BY:  Lennie Odor, P.A. PROCEDURE DATE:  02/23/2014 PROCEDURE:     Diagnostic EGD  INDICATIONS:    epigastric/  left upper quadrant/flank pain; history of gastric obesity procedure.  INFORMED CONSENT:   The risks, benefits, limitations, alternatives and imponderables have been discussed.  The potential for biopsy, esophogeal dilation, etc. have also been reviewed.  Questions have been answered.  All parties agreeable.  Please see the history and physical in the medical record for more information.  MEDICATIONS: Versed 14 mg IV and Demerol 200 mg IV in divided doses. 25 mg IV and Zofran 4 mg IV. Xylocaine gel orally.  DESCRIPTION OF PROCEDURE:   The EG-[id number] ([id number])  endoscope was introduced through the mouth and advanced to the second portion of the duodenum without difficulty or limitations.  The mucosal surfaces were surveyed very carefully during advancement of the scope and upon withdrawal.  Retroflexion view of the proximal stomach and esophagogastric junction was performed.      FINDINGS: Normal esophagus. Surgically altered stomach with relatively small gastric pouch. Once efferent limb. Residual gastric and small bowel mucosa appeared normal.  THERAPEUTIC / DIAGNOSTIC MANEUVERS PERFORMED:  None   COMPLICATIONS:  None  IMPRESSION:   Status post gastric obesity procedure. No endoscopic explanation for patient's symptoms.  RECOMMENDATIONS:   Proceed with CT abdomen / pelvis as planned.    _______________________________ R. Garfield Cornea, MD FACP [facility] eSigned:  R. Garfield Cornea, MD FACP [facility] 02/23/2014 2:56 PM     CC:

## 2014-02-23 NOTE — Interval H&P Note (Signed)
History and Physical Interval Note:  02/23/2014 2:07 PM  KYANNAH CLIMER  has presented today for surgery, with the diagnosis of ABDOMINAL PAIN  AND CONSTIPATION  The various methods of treatment have been discussed with the patient and family. After consideration of risks, benefits and other options for treatment, the patient has consented to  Procedure(s) with comments: ESOPHAGOGASTRODUODENOSCOPY (EGD) (N/A) - 1:15-moved to [place] to notify pt as a surgical intervention .  The patient's history has been reviewed, patient examined, no change in status, stable for surgery.  I have reviewed the patient's chart and labs.  Questions were answered to the patient's satisfaction.     No change. EGD per plan.The risks, benefits, limitations, alternatives and imponderables have been reviewed with the patient. Potential for esophageal dilation, biopsy, etc. have also been reviewed.  Questions have been answered. All parties agreeable.  Cristopher Estimable Raydel Hosick

## 2014-02-23 NOTE — Discharge Instructions (Signed)
EGD Discharge instructions Please read the instructions outlined below and refer to this sheet in the next few weeks. These discharge instructions provide you with general information on caring for yourself after you leave the hospital. Your doctor may also give you specific instructions. While your treatment has been planned according to the most current medical practices available, unavoidable complications occasionally occur. If you have any problems or questions after discharge, please call your doctor. ACTIVITY  You may resume your regular activity but move at a slower pace for the next 24 hours.   Take frequent rest periods for the next 24 hours.   Walking will help expel (get rid of) the air and reduce the bloated feeling in your abdomen.   No driving for 24 hours (because of the anesthesia (medicine) used during the test).   You may shower.   Do not sign any important legal documents or operate any machinery for 24 hours (because of the anesthesia used during the test).  NUTRITION  Drink plenty of fluids.   You may resume your normal diet.   Begin with a light meal and progress to your normal diet.   Avoid alcoholic beverages for 24 hours or as instructed by your caregiver.  MEDICATIONS  You may resume your normal medications unless your caregiver tells you otherwise.  WHAT YOU CAN EXPECT TODAY  You may experience abdominal discomfort such as a feeling of fullness or gas pains.  FOLLOW-UP  Your doctor will discuss the results of your test with you.  SEEK IMMEDIATE MEDICAL ATTENTION IF ANY OF THE FOLLOWING OCCUR:  Excessive nausea (feeling sick to your stomach) and/or vomiting.   Severe abdominal pain and distention (swelling).   Trouble swallowing.   Temperature over 101 F (37.8 C).   Rectal bleeding or vomiting of blood.   Keep appointment for abdominal and pelvic CT scan scheduled for Friday

## 2014-02-23 NOTE — Interval H&P Note (Signed)
History and Physical Interval Note:  02/23/2014 2:06 PM  Jordan Velez  has presented today for surgery, with the diagnosis of ABDOMINAL PAIN  AND CONSTIPATION  The various methods of treatment have been discussed with the patient and family. After consideration of risks, benefits and other options for treatment, the patient has consented to  Procedure(s) with comments: ESOPHAGOGASTRODUODENOSCOPY (EGD) (N/A) - 1:15-moved to [place] to notify pt as a surgical intervention .  The patient's history has been reviewed, patient examined, no change in status, stable for surgery.  I have reviewed the patient's chart and labs.  Questions were answered to the patient's satisfaction.     Cristopher Estimable Rene Sizelove  No change. EGD per plan.The risks, benefits, limitations, alternatives and imponderables have been reviewed with the patient. Potential for esophageal dilation, biopsy, etc. have also been reviewed.  Questions have been answered. All parties agreeable.

## 2014-02-23 NOTE — H&P (View-Only) (Signed)
Primary Care Physician:  REDMON,NOELLE, PA-C Primary Gastroenterologist:  Dr.   Laurel Dimmer Complaint  Patient presents with  . Abdominal Pain    HPI:   Jordan Velez presents today at the request of [facility] ED secondary to abdominal pain.Remote hx of IBS.  Laparoscopic Roux-en-Y gastric bypass in 2004. April 15th started having a violent pain left side of back, radiating around to LUQ and up to epigastric region, precipitated by eating. Was curled up in fetal position at hospital but went away 3 hours later. Now eating precipitates severe abdominal pain. N/V is chronic but worsening since April 15th. Eating extremely small portions due to pain. No melena. Was taking Ibuprofen multiple times a day. Still taking every once awhile due to chronic pain issues due to other comorbidities. Has hx of interstitial cystitis, chronic pain. Not as bad pain with soft foods. Denies dysphagia. Was given Prilosec BID. Doesn't think she is absorbing it.    No changes in medication. Constipation worsening. BM every other day.   Past Medical History  Diagnosis Date  . Endometriosis   . Pelvic pain   . Ovarian cyst     RECURRENT  . IC (interstitial cystitis)   . Migraine   . Chronic back pain   . Chronic nausea     POST GASTRIC BYPASS  . S/P gastric bypass     2004  . Hypertension     Past Surgical History  Procedure Laterality Date  . Hysteroscopy w/d&c  02-10-2003  . Roux-en-y procedure  06-14-2003  . Cystourethroscopy/  hydodistention/ bladder bx  01-02-2006  . Endometrial ablation w/ novasure  2007  . Laparoscopy N/A 09/11/2013    Procedure: LAPAROSCOPY DIAGNOSTIC , lysis of adhesions, ablation of endometrious;  Surgeon: Luz Lex, MD;  Location: [facility];  Service: Gynecology;  Laterality: N/A;  . Salpingoophorectomy Right 09/11/2013    Procedure: SALPINGO OOPHORECTOMY;  Surgeon: Luz Lex, MD;  Location: [facility];  Service: Gynecology;  Laterality:  Right;  . Tens unit      Current Outpatient Prescriptions  Medication Sig Dispense Refill  . amitriptyline (ELAVIL) 150 MG tablet Take 75-150 mg by mouth at bedtime.       . clonazePAM (KLONOPIN) 1 MG tablet Take 1 mg by mouth 3 (three) times daily.      . clotrimazole-betamethasone (LOTRISONE) cream Apply 1 application topically 2 (two) times daily. Apply twice daily as needed  30 g  0  . cyclobenzaprine (FLEXERIL) 10 MG tablet Take 10 mg by mouth 3 (three) times daily as needed for muscle spasms.      Marland Kitchen ibuprofen (ADVIL,MOTRIN) 200 MG tablet Take 200 mg by mouth daily as needed for mild pain or moderate pain.      Marland Kitchen lisinopril-hydrochlorothiazide (PRINZIDE,ZESTORETIC) 20-25 MG per tablet Take 1 tablet by mouth every morning.      . ondansetron (ZOFRAN) 4 MG tablet Take 1 tablet (4 mg total) by mouth every 8 (eight) hours as needed for nausea or vomiting.  12 tablet  0  . PRESCRIPTION MEDICATION 23 mLs by Intracatheter route daily as needed (for bladder pain). Sodium Bicarb, Heparin, Lidocaine: combination inserted thru catheter into bladder.      . sulfamethoxazole-trimethoprim (BACTRIM DS) 800-160 MG per tablet Take 1 tablet by mouth as needed.      . zolpidem (AMBIEN) 10 MG tablet Take 10 mg by mouth at bedtime as needed for sleep.       Marland Kitchen  lansoprazole (PREVACID SOLUTAB) 30 MG disintegrating tablet Take 1 tablet (30 mg total) by mouth daily.  60 tablet  3  . Linaclotide (LINZESS) 145 MCG CAPS capsule Take 1 capsule (145 mcg total) by mouth daily. Take 30 prior to breakfast  30 capsule  5  . Oxycodone HCl 10 MG TABS Take 1 tablet (10 mg total) by mouth every 6 (six) hours.  30 tablet  0  . sucralfate (CARAFATE) 1 GM/10ML suspension Take 10 mLs (1 g total) by mouth 4 (four) times daily.  420 mL  1   No current facility-administered medications for this visit.    Allergies as of 02/11/2014 - Review Complete 02/11/2014  Allergen Reaction Noted  . Codeine Itching 09/09/2013  . Nsaids   01/06/2014    Family History  Problem Relation Age of Onset  . Colon cancer Neg Hx     History   Social History  . Marital Status: Married    Spouse Name: N/A    Number of Children: N/A  . Years of Education: N/A   Occupational History  . Not on file.   Social History Main Topics  . Smoking status: Never Smoker   . Smokeless tobacco: Never Used  . Alcohol Use: No  . Drug Use: No  . Sexual Activity: Not on file   Other Topics Concern  . Not on file   Social History Narrative  . No narrative on file    Review of Systems: As mentioned in HPI.   Physical Exam: BP 108/73  Pulse 102  Temp(Src) 98.4 F (36.9 C) (Oral)  Ht 5\' 6"  (1.676 m)  Wt 221 lb 6.4 oz (100.426 kg)  BMI 35.75 kg/m2 General:   Alert and oriented. Pleasant and cooperative. Well-nourished and well-developed.  Head:  Normocephalic and atraumatic. Eyes:  Without icterus, sclera clear and conjunctiva pink.  Ears:  Normal auditory acuity. Nose:  No deformity, discharge,  or lesions. Mouth:  No deformity or lesions, oral mucosa pink.  Lungs:  Clear to auscultation bilaterally. No wheezes, rales, or rhonchi. No distress.  Heart:  S1, S2 present without murmurs appreciated.  Abdomen:  +BS, soft, non-tender and non-distended. No HSM noted. No guarding or rebound. No masses appreciated.  Rectal:  Deferred  Msk:  Symmetrical without gross deformities. Normal posture. Extremities:  Without clubbing or edema. Neurologic:  Alert and  oriented x4;  grossly normal neurologically. Skin:  Intact without significant lesions or rashes. Psych:  Alert and cooperative. Normal mood and affect.   Lab Results  Component Value Date   WBC 10.3 01/06/2014   HGB 12.4 01/06/2014   HCT 37.6 01/06/2014   MCV 84.5 01/06/2014   PLT 390 01/06/2014   Lab Results  Component Value Date   ALT 15 01/06/2014   AST 16 01/06/2014   ALKPHOS 61 01/06/2014   BILITOT 0.3 01/06/2014

## 2014-02-24 ENCOUNTER — Encounter (HOSPITAL_COMMUNITY): Payer: Self-pay | Admitting: Internal Medicine

## 2014-02-26 ENCOUNTER — Ambulatory Visit (HOSPITAL_COMMUNITY)
Admission: RE | Admit: 2014-02-26 | Discharge: 2014-02-26 | Disposition: A | Discharge status: Home / Self Care | Payer: Medicare HMO | Source: Ambulatory Visit | Attending: Gastroenterology | Admitting: Gastroenterology

## 2014-02-26 ENCOUNTER — Ambulatory Visit (HOSPITAL_COMMUNITY): Payer: Medicare HMO

## 2014-02-26 ENCOUNTER — Encounter (HOSPITAL_COMMUNITY): Payer: Self-pay

## 2014-02-26 DIAGNOSIS — Z9884 Bariatric surgery status: Secondary | ICD-10-CM | POA: Insufficient documentation

## 2014-02-26 DIAGNOSIS — R9389 Abnormal findings on diagnostic imaging of other specified body structures: Secondary | ICD-10-CM | POA: Insufficient documentation

## 2014-02-26 DIAGNOSIS — R109 Unspecified abdominal pain: Secondary | ICD-10-CM

## 2014-02-26 MED ORDER — IOHEXOL 300 MG/ML  SOLN
100.0000 mL | Freq: Once | INTRAMUSCULAR | Status: AC | PRN
  Administered 2014-02-26: 100 mL via INTRAVENOUS

## 2014-03-01 ENCOUNTER — Telehealth: Payer: Self-pay | Admitting: *Deleted

## 2014-03-01 NOTE — Telephone Encounter (Signed)
Routing to AS for results.  

## 2014-03-01 NOTE — Progress Notes (Signed)
Quick Note:  CT without evidence of obstruction, possible small left ovarian cyst.  I would like to review this with Dr. Thornton Papas in the next 24-48 hours; I will also reach out to Dr. Johnathan Hausen who performs bariatric surgery, to see if he notes any significant abnormality on the CT. However, no acute findings noted.  How is she doing? How is constipation? ______

## 2014-03-01 NOTE — Telephone Encounter (Signed)
Pt called wanting to get her results. Please advise [phone]

## 2014-03-02 NOTE — Telephone Encounter (Signed)
See result note.  

## 2014-03-03 ENCOUNTER — Other Ambulatory Visit: Payer: Self-pay | Admitting: Gastroenterology

## 2014-03-03 DIAGNOSIS — K458 Other specified abdominal hernia without obstruction or gangrene: Secondary | ICD-10-CM

## 2014-03-03 NOTE — Progress Notes (Signed)
I faxed copy of CT to PCP, they will have to initiate her referral due to her [employer]

## 2014-03-03 NOTE — Progress Notes (Signed)
Quick Note:  My differentials included anastomotic ulcer or internal hernia with her symptoms at time of initial consultation. HOWEVER, EGD was negative for ulcerative process.  I reviewed CT with Dr. Thornton Papas. Internal hernias are sometimes difficult to pick up on CT imaging. He did note a large "swirling" of the mesentery but NO obstruction. Swirling of the mesentery can be a tell-tale sign of an internal hernia. It is quite possible this is why she is having significant intermittent abdominal pain, specifically with solid foods.   I attempted to call patient, but there is no voicemail set up. I would like her set up to see one of the bariatric surgeons ASAP at [facility]. She may need exploratory laparoscopy if they feel she is dealing with an internal hernia. In the interim, she needs to eat soft foods, liquids, protein supplementation via boost, ensure, or something of that nature. IF she has severe abdominal pain that does not go away, go to the ED in [place]. ______

## 2014-03-04 ENCOUNTER — Telehealth: Payer: Self-pay

## 2014-03-04 NOTE — Telephone Encounter (Signed)
Pt called- she will run out of her pain medication prior to getting appt with [facility]. Pt wants to know if she can have a refill?

## 2014-03-05 NOTE — Telephone Encounter (Signed)
Patient called back and I explained again regarding her referral needing to come from her PCP to [place], her PCP is aware I faxed the copy of the CT scan to them. She wants to talk to Almyra Free today before we close she does have a couple questions

## 2014-03-05 NOTE — Telephone Encounter (Signed)
I have never seen this patient and don't feel comfortable prescribing narcotics. Feel free to ask Vicente Males next week or Dr. Gala Romney.

## 2014-03-08 NOTE — Telephone Encounter (Signed)
Pt called stating she is hurting really bad, pt said her PCP made her a appointment for 04/01/14 with [facility] pt said she can't wait that long. Pt said she also needs a RX. Pt wants to speak with Almyra Free or Vicente Males. Please advise [phone]

## 2014-03-08 NOTE — Telephone Encounter (Signed)
Jordan Velez, pt has been in a lot of pain, no fever, no vomiting. She has been taking extra pain medication to deal with it. She wants to know if you will write her another rx to last her the next 10 days (she will be out of meds tomorrow), and she will be able to get her regular rx for pain meds refilled in 10 days.  her appt at [facility] until 04/01/14.   Roosvelt Maser had wanted this pt to have an asap appointment. Can you call and get her in any sooner?

## 2014-03-09 MED ORDER — OXYCODONE HCL 5 MG PO CAPS: 5.0000 mg | ORAL_CAPSULE | Freq: Four times a day (QID) | ORAL | Status: DC | PRN

## 2014-03-09 NOTE — Telephone Encounter (Signed)
Yes, I can give for just the next 10 days, but if she finds that this is not alleviating pain, she needs to go to the ED. We need to try and get patient in sooner to [facility].

## 2014-03-09 NOTE — Telephone Encounter (Signed)
I called [facility] and they have her on a cancellation list to get her in sooner if someone cancels

## 2014-03-09 NOTE — Telephone Encounter (Signed)
Pt is aware. rx is at the front desk.

## 2014-03-15 ENCOUNTER — Inpatient Hospital Stay (HOSPITAL_COMMUNITY): Payer: Medicare HMO

## 2014-03-15 ENCOUNTER — Encounter (HOSPITAL_COMMUNITY): Payer: Self-pay | Admitting: *Deleted

## 2014-03-15 ENCOUNTER — Inpatient Hospital Stay (HOSPITAL_COMMUNITY)
Admission: AD | Admit: 2014-03-15 | Discharge: 2014-03-15 | Disposition: A | Discharge status: Home / Self Care | Payer: Medicare HMO | Source: Ambulatory Visit | Attending: Obstetrics and Gynecology | Admitting: Obstetrics and Gynecology

## 2014-03-15 DIAGNOSIS — I10 Essential (primary) hypertension: Secondary | ICD-10-CM | POA: Insufficient documentation

## 2014-03-15 DIAGNOSIS — M549 Dorsalgia, unspecified: Secondary | ICD-10-CM | POA: Insufficient documentation

## 2014-03-15 DIAGNOSIS — N83209 Unspecified ovarian cyst, unspecified side: Secondary | ICD-10-CM | POA: Insufficient documentation

## 2014-03-15 DIAGNOSIS — N3010 Interstitial cystitis (chronic) without hematuria: Secondary | ICD-10-CM | POA: Insufficient documentation

## 2014-03-15 DIAGNOSIS — R1032 Left lower quadrant pain: Secondary | ICD-10-CM

## 2014-03-15 DIAGNOSIS — G8929 Other chronic pain: Secondary | ICD-10-CM | POA: Insufficient documentation

## 2014-03-15 HISTORY — DX: Personal history of other diseases of the digestive system: Z87.19

## 2014-03-15 HISTORY — DX: Major depressive disorder, single episode, unspecified: F32.9

## 2014-03-15 HISTORY — DX: Anemia, unspecified: D64.9

## 2014-03-15 HISTORY — DX: Tachycardia, unspecified: R00.0

## 2014-03-15 HISTORY — DX: Depression, unspecified: F32.A

## 2014-03-15 HISTORY — DX: Other specified health status: Z78.9

## 2014-03-15 LAB — WET PREP, GENITAL
CLUE CELLS WET PREP: NONE SEEN
Trich, Wet Prep: NONE SEEN
YEAST WET PREP: NONE SEEN

## 2014-03-15 LAB — POCT PREGNANCY, URINE: Preg Test, Ur: NEGATIVE

## 2014-03-15 LAB — URINALYSIS, ROUTINE W REFLEX MICROSCOPIC
BILIRUBIN URINE: NEGATIVE
GLUCOSE, UA: NEGATIVE mg/dL
HGB URINE DIPSTICK: NEGATIVE
Ketones, ur: NEGATIVE mg/dL
Leukocytes, UA: NEGATIVE
Nitrite: NEGATIVE
PH: 6.5 (ref 5.0–8.0)
Protein, ur: NEGATIVE mg/dL
SPECIFIC GRAVITY, URINE: 1.01 (ref 1.005–1.030)
UROBILINOGEN UA: 0.2 mg/dL (ref 0.0–1.0)

## 2014-03-15 MED ORDER — OXYCODONE HCL 5 MG PO TABS: 5.0000 mg | ORAL_TABLET | ORAL | Status: AC | PRN

## 2014-03-15 MED ORDER — ONDANSETRON 8 MG PO TBDP
8.0000 mg | ORAL_TABLET | ORAL | Status: AC
  Administered 2014-03-15: 8 mg via ORAL
  Filled 2014-03-15: qty 1

## 2014-03-15 MED ORDER — OXYCODONE HCL 5 MG PO TABS
10.0000 mg | ORAL_TABLET | ORAL | Status: AC
  Administered 2014-03-15: 10 mg via ORAL
  Filled 2014-03-15: qty 2

## 2014-03-15 NOTE — MAU Note (Signed)
C/o back pain and ovarian cyst pain for past 2 weeks; has an appointment with Dr Radene Knee this Wed; is on a waiting list for surgery for her hiatal hernia;

## 2014-03-15 NOTE — MAU Provider Note (Signed)
Chief Complaint: Back Pain and Cyst   First Provider Initiated Contact with Patient 03/15/14 0824     SUBJECTIVE HPI: Jordan Velez is a 43 y.o. G1P1001 who presents to maternity admissions reporting pain in her abdomen in LLQ and back pain with known ovarian cyst.  She reports she has chronic pain from pelvic floor problems, interstitial cystitis, and a hiatal hernia but her pain medication for these is not working for the new onset pain on the left side.  She takes Oxycodone 5 mg QID for her chronic pain prescribed by her PCP, Dr Amalia Hailey.  She has hx of PCOS with multiple ovarian cysts, hx of cyst rupture with pain. She had CT scan 1 month ago for abdominal pain in which hiatal hernia was diagnosed, and was told she had a cyst on her left ovary at that time.  Her right ovary was surgically removed.  She has appt to see Dr Corinna Capra in the office on Wednesday but did not think she would wait that long since she is in so much pain.  She reports some vaginal spotting last week but none today.  She is s/p Novasure ablation and has not had vaginal bleeding x4 years.  She denies vaginal itching/burning, h/a, dizziness, vomiting, or fever/chills.  .   Past Medical History  Diagnosis Date  . Endometriosis   . Pelvic pain   . Ovarian cyst     RECURRENT  . IC (interstitial cystitis)   . Migraine   . Chronic back pain   . Chronic nausea     POST GASTRIC BYPASS  . S/P gastric bypass     2004  . Hypertension   . Depression   . Anemia   . H/O hiatal hernia   . Self-catheterizes urinary bladder   . Pulse fast    Past Surgical History  Procedure Laterality Date  . Hysteroscopy w/d&c  02-10-2003  . Roux-en-y procedure  06-14-2003  . Cystourethroscopy/  hydodistention/ bladder bx  01-02-2006  . Endometrial ablation w/ novasure  2007  . Laparoscopy N/A 09/11/2013    Procedure: LAPAROSCOPY DIAGNOSTIC , lysis of adhesions, ablation of endometrious;  Surgeon: Luz Lex, MD;  Location: [facility];  Service: Gynecology;  Laterality: N/A;  . Salpingoophorectomy Right 09/11/2013    Procedure: SALPINGO OOPHORECTOMY;  Surgeon: Luz Lex, MD;  Location: [facility];  Service: Gynecology;  Laterality: Right;  . Tens unit    . Esophagogastroduodenoscopy N/A 02/23/2014    Procedure: ESOPHAGOGASTRODUODENOSCOPY (EGD);  Surgeon: Daneil Dolin, MD;  Location: [facility];  Service: Endoscopy;  Laterality: N/A;  1:15-moved to [place] to notify pt  . Gastric bypass    . Dilation and curettage of uterus     History   Social History  . Marital Status: Married    Spouse Name: N/A    Number of Children: N/A  . Years of Education: N/A   Occupational History  . Not on file.   Social History Main Topics  . Smoking status: Never Smoker   . Smokeless tobacco: Never Used  . Alcohol Use: No  . Drug Use: No  . Sexual Activity: Not on file   Other Topics Concern  . Not on file   Social History Narrative  . No narrative on file   No current facility-administered medications on file prior to encounter.   Current Outpatient Prescriptions on File Prior to Encounter  Medication Sig Dispense Refill  . amitriptyline (  ELAVIL) 150 MG tablet Take 75 mg by mouth 2 (two) times daily.       . clonazePAM (KLONOPIN) 1 MG tablet Take 1 mg by mouth 3 (three) times daily.      Marland Kitchen lisinopril-hydrochlorothiazide (PRINZIDE,ZESTORETIC) 20-25 MG per tablet Take 1 tablet by mouth every morning.      . ondansetron (ZOFRAN) 4 MG tablet Take 1 tablet (4 mg total) by mouth every 8 (eight) hours as needed for nausea or vomiting.  12 tablet  0  . oxycodone (OXY-IR) 5 MG capsule Take 1 capsule (5 mg total) by mouth every 6 (six) hours as needed for pain.  30 capsule  0  . PRESCRIPTION MEDICATION 23 mLs by Intracatheter route daily as needed (for bladder pain). Sodium Bicarb, Heparin, Lidocaine: combination inserted thru catheter into bladder.      . sulfamethoxazole-trimethoprim (BACTRIM DS)  800-160 MG per tablet Take 1 tablet by mouth as needed.      . zolpidem (AMBIEN) 10 MG tablet Take 10 mg by mouth at bedtime as needed for sleep.        Allergies  Allergen Reactions  . Codeine Itching  . Nsaids     Cannot take due to surgical procedure     ROS: Pertinent items in HPI  OBJECTIVE Blood pressure 113/80, pulse 114, temperature 99.3 F (37.4 C), temperature source Oral, resp. rate 20. GENERAL: Well-developed, well-nourished female in no acute distress.  HEENT: Normocephalic HEART: normal rate RESP: normal effort ABDOMEN: Soft, non-tender EXTREMITIES: Nontender, no edema NEURO: Alert and oriented Pelvic exam: Cervix pink, visually closed, without lesion, moderate amount white creamy discharge, no blood noted, vaginal walls and external genitalia normal Bimanual exam: Cervix 0/long/high, firm, anterior, neg CMT, uterus nontender, nonenlarged, no pain in right adnexal region, no mass palpable, left adnexa with no tenderness, enlargement or mass  LAB RESULTS Results for orders placed during the hospital encounter of 03/15/14 (from the past 24 hour(s))  URINALYSIS, ROUTINE W REFLEX MICROSCOPIC     Status: None   Collection Time    03/15/14  7:40 AM      Result Value Ref Range   Color, Urine YELLOW  YELLOW   APPearance CLEAR  CLEAR   Specific Gravity, Urine 1.010  1.005 - 1.030   pH 6.5  5.0 - 8.0   Glucose, UA NEGATIVE  NEGATIVE mg/dL   Hgb urine dipstick NEGATIVE  NEGATIVE   Bilirubin Urine NEGATIVE  NEGATIVE   Ketones, ur NEGATIVE  NEGATIVE mg/dL   Protein, ur NEGATIVE  NEGATIVE mg/dL   Urobilinogen, UA 0.2  0.0 - 1.0 mg/dL   Nitrite NEGATIVE  NEGATIVE   Leukocytes, UA NEGATIVE  NEGATIVE  POCT PREGNANCY, URINE     Status: None   Collection Time    03/15/14  7:49 AM      Result Value Ref Range   Preg Test, Ur NEGATIVE  NEGATIVE  WET PREP, GENITAL     Status: Abnormal   Collection Time    03/15/14  8:45 AM      Result Value Ref Range   Yeast Wet Prep HPF  POC NONE SEEN  NONE SEEN   Trich, Wet Prep NONE SEEN  NONE SEEN   Clue Cells Wet Prep HPF POC NONE SEEN  NONE SEEN   WBC, Wet Prep HPF POC FEW (*) NONE SEEN    IMAGING Ct Abdomen Pelvis W Contrast  02/26/2014   CLINICAL DATA:  Abdominal pain, prior gastric bypass  EXAM: CT ABDOMEN AND  PELVIS WITH CONTRAST  TECHNIQUE: Multidetector CT imaging of the abdomen and pelvis was performed using the standard protocol following bolus administration of intravenous contrast.  CONTRAST:  1100mL OMNIPAQUE IOHEXOL 300 MG/ML  SOLN  COMPARISON:  09/19/2013  FINDINGS: The lung bases are clear. Normal heart size. No pericardial or pleural effusion. No hilar hernia.  Abdomen: Prior Roux-en-Y gastric bypass noted. Negative for bowel obstruction, dilatation, ileus, or free air.  Liver, gallbladder, biliary system, pancreas, spleen, adrenal glands, and kidneys are within normal limits for age and demonstrate no acute process.  Minimal aortic bifurcation atherosclerosis. No aneurysm or retroperitoneal process.  No abdominal free fluid, fluid collection, hemorrhage, abscess, or adenopathy.  Normal appendix demonstrated.  Pelvis: Prior tubal ligation noted. Left ovarian ill-defined hypodensity cystic area measures 3.2 x 2.3 cm, suspect small left ovarian cyst. Urinary bladder unremarkable. No acute distal bowel process. No pelvic free fluid, fluid collection, hemorrhage, abscess, adenopathy, inguinal abnormality, or hernia.  Diffuse degenerative changes of the spine. Left gluteal stimulator device noted.  IMPRESSION: Prior Roux-en-Y gastric bypass.  No acute intra-abdominal or pelvic finding.  Possible 3.2 cm left ovarian cyst.   Electronically Signed   By: Daryll Brod M.D.   On: 02/26/2014 10:23    ASSESSMENT 1. Chronic pain   2. LLQ abdominal pain     PLAN Consult Dr Gaetano Net Roxicodone 5 x 2 tabs in MAU Discharge home Rx for Roxicodone 5 x20 tabs  Keep scheduled appt with Dr Corinna Capra this week    Medication List          acetaminophen 500 MG tablet  Commonly known as:  TYLENOL  Take 500 mg by mouth daily as needed for mild pain or moderate pain.     AMBIEN 10 MG tablet  Generic drug:  zolpidem  Take 10 mg by mouth at bedtime as needed for sleep.     amitriptyline 150 MG tablet  Commonly known as:  ELAVIL  Take 75 mg by mouth 2 (two) times daily.     calcium carbonate 500 MG chewable tablet  Commonly known as:  TUMS - dosed in mg elemental calcium  Chew 2 tablets by mouth 2 (two) times daily as needed for indigestion or heartburn.     clonazePAM 1 MG tablet  Commonly known as:  KLONOPIN  Take 1 mg by mouth 3 (three) times daily.     lisinopril-hydrochlorothiazide 20-25 MG per tablet  Commonly known as:  PRINZIDE,ZESTORETIC  Take 1 tablet by mouth every morning.     ondansetron 4 MG tablet  Commonly known as:  ZOFRAN  Take 1 tablet (4 mg total) by mouth every 8 (eight) hours as needed for nausea or vomiting.     oxycodone 5 MG capsule  Commonly known as:  OXY-IR  Take 1 capsule (5 mg total) by mouth every 6 (six) hours as needed for pain.     oxyCODONE 5 MG immediate release tablet  Commonly known as:  ROXICODONE  Take 1 tablet (5 mg total) by mouth every 4 (four) hours as needed for severe pain.     PRESCRIPTION MEDICATION  23 mLs by Intracatheter route daily as needed (for bladder pain). Sodium Bicarb, Heparin, Lidocaine: combination inserted thru catheter into bladder.     sulfamethoxazole-trimethoprim 800-160 MG per tablet  Commonly known as:  BACTRIM DS  Take 1 tablet by mouth as needed.       Follow-up Information   Follow up with LOWE,DAVID C, MD. (As scheduled)  Specialty:  Obstetrics and Gynecology   Contact information:   [place], [place] [address] [phone]       Fatima Blank Certified Nurse-Midwife 03/15/2014  10:30 AM

## 2014-03-15 NOTE — Discharge Instructions (Signed)
Abdominal Pain, Women °Abdominal (stomach, pelvic, or belly) pain can be caused by many things. It is important to tell your doctor: °· The location of the pain. °· Does it come and go or is it present all the time? °· Are there things that start the pain (eating certain foods, exercise)? °· Are there other symptoms associated with the pain (fever, nausea, vomiting, diarrhea)? °All of this is helpful to know when trying to find the cause of the pain. °CAUSES  °· Stomach: virus or bacteria infection, or ulcer. °· Intestine: appendicitis (inflamed appendix), regional ileitis (Crohn's disease), ulcerative colitis (inflamed colon), irritable bowel syndrome, diverticulitis (inflamed diverticulum of the colon), or cancer of the stomach or intestine. °· Gallbladder disease or stones in the gallbladder. °· Kidney disease, kidney stones, or infection. °· Pancreas infection or cancer. °· Fibromyalgia (pain disorder). °· Diseases of the female organs: °· Uterus: fibroid (non-cancerous) tumors or infection. °· Fallopian tubes: infection or tubal pregnancy. °· Ovary: cysts or tumors. °· Pelvic adhesions (scar tissue). °· Endometriosis (uterus lining tissue growing in the pelvis and on the pelvic organs). °· Pelvic congestion syndrome (female organs filling up with blood just before the menstrual period). °· Pain with the menstrual period. °· Pain with ovulation (producing an egg). °· Pain with an IUD (intrauterine device, birth control) in the uterus. °· Cancer of the female organs. °· Functional pain (pain not caused by a disease, may improve without treatment). °· Psychological pain. °· Depression. °DIAGNOSIS  °Your doctor will decide the seriousness of your pain by doing an examination. °· Blood tests. °· X-rays. °· Ultrasound. °· CT scan (computed tomography, special type of X-ray). °· MRI (magnetic resonance imaging). °· Cultures, for infection. °· Barium enema (dye inserted in the large intestine, to better view it with  X-rays). °· Colonoscopy (looking in intestine with a lighted tube). °· Laparoscopy (minor surgery, looking in abdomen with a lighted tube). °· Major abdominal exploratory surgery (looking in abdomen with a large incision). °TREATMENT  °The treatment will depend on the cause of the pain.  °· Many cases can be observed and treated at home. °· Over-the-counter medicines recommended by your caregiver. °· Prescription medicine. °· Antibiotics, for infection. °· Birth control pills, for painful periods or for ovulation pain. °· Hormone treatment, for endometriosis. °· Nerve blocking injections. °· Physical therapy. °· Antidepressants. °· Counseling with a psychologist or psychiatrist. °· Minor or major surgery. °HOME CARE INSTRUCTIONS  °· Do not take laxatives, unless directed by your caregiver. °· Take over-the-counter pain medicine only if ordered by your caregiver. Do not take aspirin because it can cause an upset stomach or bleeding. °· Try a clear liquid diet (broth or water) as ordered by your caregiver. Slowly move to a bland diet, as tolerated, if the pain is related to the stomach or intestine. °· Have a thermometer and take your temperature several times a day, and record it. °· Bed rest and sleep, if it helps the pain. °· Avoid sexual intercourse, if it causes pain. °· Avoid stressful situations. °· Keep your follow-up appointments and tests, as your caregiver orders. °· If the pain does not go away with medicine or surgery, you may try: °· Acupuncture. °· Relaxation exercises (yoga, meditation). °· Group therapy. °· Counseling. °SEEK MEDICAL CARE IF:  °· You notice certain foods cause stomach pain. °· Your home care treatment is not helping your pain. °· You need stronger pain medicine. °· You want your IUD removed. °· You feel faint or   lightheaded. °· You develop nausea and vomiting. °· You develop a rash. °· You are having side effects or an allergy to your medicine. °SEEK IMMEDIATE MEDICAL CARE IF:  °· Your  pain does not go away or gets worse. °· You have a fever. °· Your pain is felt only in portions of the abdomen. The right side could possibly be appendicitis. The left lower portion of the abdomen could be colitis or diverticulitis. °· You are passing blood in your stools (bright red or black tarry stools, with or without vomiting). °· You have blood in your urine. °· You develop chills, with or without a fever. °· You pass out. °MAKE SURE YOU:  °· Understand these instructions. °· Will watch your condition. °· Will get help right away if you are not doing well or get worse. °Document Released: 07/08/2007 Document Revised: 12/03/2011 Document Reviewed: 07/28/2009 °ExitCare® Patient Information ©2015 [employer]. This information is not intended to replace advice given to you by your health care provider. Make sure you discuss any questions you have with your health care provider. ° °Chronic Pain °Chronic pain can be defined as pain that is off and on and lasts for 3-6 months or longer. Many things cause chronic pain, which can make it difficult to make a diagnosis. There are many treatment options available for chronic pain. However, finding a treatment that works well for you may require trying various approaches until the right one is found. Many people benefit from a combination of two or more types of treatment to control their pain. °SYMPTOMS  °Chronic pain can occur anywhere in the body and can range from mild to very severe. Some types of chronic pain include: °· Headache. °· Low back pain. °· Cancer pain. °· Arthritis pain. °· Neurogenic pain. This is pain resulting from damage to nerves. ° People with chronic pain may also have other symptoms such as: °· Depression. °· Anger. °· Insomnia. °· Anxiety. °DIAGNOSIS  °Your health care provider will help diagnose your condition over time. In many cases, the initial focus will be on excluding possible conditions that could be causing the pain. Depending on your  symptoms, your health care provider may order tests to diagnose your condition. Some of these tests may include:  °· Blood tests.   °· CT scan.   °· MRI.   °· X-rays.   °· Ultrasounds.   °· Nerve conduction studies.   °You may need to see a specialist.  °TREATMENT  °Finding treatment that works well may take time. You may be referred to a pain specialist. He or she may prescribe medicine or therapies, such as:  °· Mindful meditation or yoga. °· Shots (injections) of numbing or pain-relieving medicines into the spine or area of pain. °· Local electrical stimulation. °· Acupuncture.   °· Massage therapy.   °· Aroma, color, light, or sound therapy.   °· Biofeedback.   °· Working with a physical therapist to keep from getting stiff.   °· Regular, gentle exercise.   °· Cognitive or behavioral therapy.   °· Group support.   °Sometimes, surgery may be recommended.  °HOME CARE INSTRUCTIONS  °· Take all medicines as directed by your health care provider.   °· Lessen stress in your life by relaxing and doing things such as listening to calming music.   °· Exercise or be active as directed by your health care provider.   °· Eat a healthy diet and include things such as vegetables, fruits, fish, and lean meats in your diet.   °· Keep all follow-up appointments with your health care provider.   °·   Attend a support group with others suffering from chronic pain. °SEEK MEDICAL CARE IF:  °· Your pain gets worse.   °· You develop a new pain that was not there before.   °· You cannot tolerate medicines given to you by your health care provider.   °· You have new symptoms since your last visit with your health care provider.   °SEEK IMMEDIATE MEDICAL CARE IF:  °· You feel weak.   °· You have decreased sensation or numbness.   °· You lose control of bowel or bladder function.   °· Your pain suddenly gets much worse.   °· You develop shaking. °· You develop chills. °· You develop confusion. °· You develop chest pain. °· You develop  shortness of breath.   °MAKE SURE YOU: °· Understand these instructions. °· Will watch your condition. °· Will get help right away if you are not doing well or get worse. °Document Released: 06/02/2002 Document Revised: 05/13/2013 Document Reviewed: 03/06/2013 °ExitCare® Patient Information ©2015 [employer]. This information is not intended to replace advice given to you by your health care provider. Make sure you discuss any questions you have with your health care provider. ° °

## 2014-03-16 LAB — GC/CHLAMYDIA PROBE AMP
CT PROBE, AMP APTIMA: NEGATIVE
GC PROBE AMP APTIMA: NEGATIVE

## 2014-04-01 ENCOUNTER — Encounter (HOSPITAL_COMMUNITY): Payer: Self-pay | Admitting: *Deleted

## 2014-04-01 ENCOUNTER — Ambulatory Visit (INDEPENDENT_AMBULATORY_CARE_PROVIDER_SITE_OTHER): Payer: Commercial Managed Care - HMO | Admitting: Surgery

## 2014-04-01 ENCOUNTER — Encounter (INDEPENDENT_AMBULATORY_CARE_PROVIDER_SITE_OTHER): Payer: Self-pay | Admitting: Surgery

## 2014-04-01 VITALS — BP 126/74 | HR 108 | Temp 98.0°F | Ht 66.0 in | Wt 223.0 lb

## 2014-04-01 DIAGNOSIS — R1012 Left upper quadrant pain: Secondary | ICD-10-CM

## 2014-04-01 NOTE — Progress Notes (Signed)
Chief Complaint:  Abdominal pain since April 15   History of Present Illness:  Jordan Velez is an 43 y.o. 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 Lucia Gaskins.  She has maintained about a 50 lb weight loss since her surgery.  On her about April 15 she began having significant left-sided abdominal pain. She had a history of a ovarian cyst and underwent surgery to remove the cyst in her fallopian tubes but the pain has continued. She does not tolerate a solid diet but is able to make maintained on a liquid diet. CT scan was performed and he can which was red as showing ovarian cyst but did not show anything else. However further review there is some swirling seen in the mesentery suggestive of a possible internal hernia. This was reviewed by me today and we discussed laparoscopy. Dr. Lucia Gaskins was in the office with me and we were trying to her on for him to do and next few days his I will be unavailable.  She has been able to tolerate a liquid diet but has been having fairly significant left upper quadrant abdominal pain. This has not been accompanied by nausea or vomiting.  Past Medical History  Diagnosis Date  . Endometriosis   . Pelvic pain   . Ovarian cyst     RECURRENT  . IC (interstitial cystitis)   . Migraine   . Chronic back pain   . Chronic nausea     POST GASTRIC BYPASS  . S/P gastric bypass     2004  . Hypertension   . Depression   . Anemia   . H/O hiatal hernia   . Self-catheterizes urinary bladder   . Pulse fast     Past Surgical History  Procedure Laterality Date  . Hysteroscopy w/d&c  02-10-2003  . Roux-en-y procedure  06-14-2003  . Cystourethroscopy/  hydodistention/ bladder bx  01-02-2006  . Endometrial ablation w/ novasure  2007  . Laparoscopy N/A 09/11/2013    Procedure: LAPAROSCOPY DIAGNOSTIC , lysis of adhesions, ablation of endometrious;  Surgeon: Luz Lex, MD;  Location: [facility];  Service: Gynecology;   Laterality: N/A;  . Salpingoophorectomy Right 09/11/2013    Procedure: SALPINGO OOPHORECTOMY;  Surgeon: Luz Lex, MD;  Location: [facility];  Service: Gynecology;  Laterality: Right;  . Tens unit    . Esophagogastroduodenoscopy N/A 02/23/2014    Procedure: ESOPHAGOGASTRODUODENOSCOPY (EGD);  Surgeon: Daneil Dolin, MD;  [facility];  Service: Endoscopy;  Laterality: N/A;  1:15-moved to [place] to notify pt  . Gastric bypass    . Dilation and curettage of uterus      Current Outpatient Prescriptions  Medication Sig Dispense Refill  . acetaminophen (TYLENOL) 500 MG tablet Take 500 mg by mouth daily as needed for mild pain or moderate pain.      Marland Kitchen amitriptyline (ELAVIL) 150 MG tablet Take 75 mg by mouth 2 (two) times daily.       . calcium carbonate (TUMS - DOSED IN MG ELEMENTAL CALCIUM) 500 MG chewable tablet Chew 2 tablets by mouth 2 (two) times daily as needed for indigestion or heartburn.      . clonazePAM (KLONOPIN) 1 MG tablet Take 1 mg by mouth 3 (three) times daily.      Marland Kitchen lisinopril-hydrochlorothiazide (PRINZIDE,ZESTORETIC) 20-25 MG per tablet Take 1 tablet by mouth every morning.      . ondansetron (ZOFRAN) 4 MG tablet Take  1 tablet (4 mg total) by mouth every 8 (eight) hours as needed for nausea or vomiting.  12 tablet  0  . oxycodone (OXY-IR) 5 MG capsule Take 1 capsule (5 mg total) by mouth every 6 (six) hours as needed for pain.  30 capsule  0  . oxyCODONE (ROXICODONE) 5 MG immediate release tablet Take 1 tablet (5 mg total) by mouth every 4 (four) hours as needed for severe pain.  20 tablet  0  . PRESCRIPTION MEDICATION 23 mLs by Intracatheter route daily as needed (for bladder pain). Sodium Bicarb, Heparin, Lidocaine: combination inserted thru catheter into bladder.      . sulfamethoxazole-trimethoprim (BACTRIM DS) 800-160 MG per tablet Take 1 tablet by mouth as needed.      . zolpidem (AMBIEN) 10 MG tablet Take 10 mg by mouth at bedtime as  needed for sleep.        No current facility-administered medications for this visit.   Codeine and Nsaids Family History  Problem Relation Age of Onset  . Colon cancer Neg Hx   . Heart disease Maternal Grandmother    Social History:   reports that she has never smoked. She has never used smokeless tobacco. She reports that she does not drink alcohol or use illicit drugs.   REVIEW OF SYSTEMS : Positive for interstitial cystitis ; otherwise negative  Physical Exam:   Blood pressure 126/74, pulse 108, temperature 98 F (36.7 C), height 5\' 6"  (1.676 m), weight 223 lb (101.152 kg). Body mass index is 36.01 kg/(m^2).  Gen:  WDWN white female NAD  Neurological: Alert and oriented to person, place, and time. Motor and sensory function is grossly intact  Head: Normocephalic and atraumatic.  Eyes: Conjunctivae are normal. Pupils are equal, round, and reactive to light. No scleral icterus.  Neck: Normal range of motion. Neck supple. No tracheal deviation or thyromegaly present.  Cardiovascular:  SR without murmurs or gallops.  No carotid bruits Breast:  Not examined Respiratory: Effort normal.  No respiratory distress. No chest wall tenderness. Breath sounds normal.  No wheezes, rales or rhonchi.  Abdomen:  Fullness in the left upper quadrant which is a spot of her discomfort. GU:  Not examined Musculoskeletal: Normal range of motion. Extremities are nontender. No cyanosis, edema or clubbing noted Lymphadenopathy: No cervical, preauricular, postauricular or axillary adenopathy is present Skin: Skin is warm and dry. No rash noted. No diaphoresis. No erythema. No pallor. Pscyh: Normal mood and affect. Behavior is normal. Judgment and thought content normal.   LABORATORY RESULTS: No results found for this or any previous visit (from the past 48 hour(s)).   RADIOLOGY RESULTS: No results found.  Problem List: Patient Active Problem List   Diagnosis Date Noted  . Abdominal pain,  unspecified site 02/11/2014  . Fatty liver 02/11/2014  . ANXIETY 10/28/2006  . DEPRESSION 10/28/2006  . CONSTIPATION NOS 10/28/2006  . IBS 10/28/2006  . OVERACTIVE BLADDER 10/28/2006  . UTERINE POLYP 10/28/2006  . LOW BACK PAIN 10/28/2006  . URINARY INCONTINENCE 10/28/2006    Assessment & Plan: Abdominal pain with possible internal hernia noted on a CT scan performed had any scan on 02/26/2014. Will set up for laparoscopic evaluation per Dr. Lucia Gaskins or Dr. Excell Seltzer who are on call this Saturday.      Matt B. Hassell Done, MD, [facility] [phone] beeper [phone]  04/01/2014 12:15 PM

## 2014-04-02 ENCOUNTER — Encounter (HOSPITAL_COMMUNITY): Payer: Self-pay | Admitting: Pharmacy Technician

## 2014-04-02 NOTE — Progress Notes (Signed)
Left note for pharmacy technician 04/01/13 1630 of impending surgery and need to review meds if hasnt been done prior to 04/01/14

## 2014-04-03 ENCOUNTER — Encounter (HOSPITAL_COMMUNITY): Payer: Medicare HMO | Admitting: Anesthesiology

## 2014-04-03 ENCOUNTER — Encounter (HOSPITAL_COMMUNITY): Payer: Self-pay | Admitting: *Deleted

## 2014-04-03 ENCOUNTER — Ambulatory Visit (HOSPITAL_COMMUNITY): Payer: Medicare HMO | Admitting: Anesthesiology

## 2014-04-03 ENCOUNTER — Encounter (HOSPITAL_COMMUNITY)
Admission: RE | Disposition: A | Discharge status: Home / Self Care | Payer: Self-pay | Source: Ambulatory Visit | Attending: Surgery

## 2014-04-03 ENCOUNTER — Observation Stay (HOSPITAL_COMMUNITY)
Admission: RE | Admit: 2014-04-03 | Discharge: 2014-04-04 | Disposition: A | Discharge status: Home / Self Care | Payer: Medicare HMO | Source: Ambulatory Visit | Attending: Surgery | Admitting: Surgery

## 2014-04-03 DIAGNOSIS — Z6836 Body mass index (BMI) 36.0-36.9, adult: Secondary | ICD-10-CM | POA: Insufficient documentation

## 2014-04-03 DIAGNOSIS — Z79899 Other long term (current) drug therapy: Secondary | ICD-10-CM | POA: Insufficient documentation

## 2014-04-03 DIAGNOSIS — G8929 Other chronic pain: Secondary | ICD-10-CM | POA: Insufficient documentation

## 2014-04-03 DIAGNOSIS — K469 Unspecified abdominal hernia without obstruction or gangrene: Secondary | ICD-10-CM

## 2014-04-03 DIAGNOSIS — I10 Essential (primary) hypertension: Secondary | ICD-10-CM | POA: Insufficient documentation

## 2014-04-03 DIAGNOSIS — Z9884 Bariatric surgery status: Secondary | ICD-10-CM | POA: Insufficient documentation

## 2014-04-03 DIAGNOSIS — N83209 Unspecified ovarian cyst, unspecified side: Secondary | ICD-10-CM | POA: Insufficient documentation

## 2014-04-03 DIAGNOSIS — K660 Peritoneal adhesions (postprocedural) (postinfection): Secondary | ICD-10-CM | POA: Insufficient documentation

## 2014-04-03 DIAGNOSIS — K458 Other specified abdominal hernia without obstruction or gangrene: Principal | ICD-10-CM | POA: Insufficient documentation

## 2014-04-03 HISTORY — PX: LAPAROSCOPY: SHX197

## 2014-04-03 LAB — BASIC METABOLIC PANEL
ANION GAP: 15 (ref 5–15)
BUN: 15 mg/dL (ref 6–23)
CHLORIDE: 93 meq/L — AB (ref 96–112)
CO2: 24 mEq/L (ref 19–32)
Calcium: 10.2 mg/dL (ref 8.4–10.5)
Creatinine, Ser: 0.71 mg/dL (ref 0.50–1.10)
GFR calc Af Amer: >90 mL/min (ref >90)
GFR calc non Af Amer: >90 mL/min (ref >90)
Glucose, Bld: 116 mg/dL — ABNORMAL HIGH (ref 70–99)
Potassium: 4.4 mEq/L (ref 3.7–5.3)
SODIUM: 132 meq/L — AB (ref 137–147)

## 2014-04-03 LAB — CBC
HCT: 34.7 % — ABNORMAL LOW (ref 36.0–46.0)
Hemoglobin: 11.7 g/dL — ABNORMAL LOW (ref 12.0–15.0)
MCH: 27.2 pg (ref 26.0–34.0)
MCHC: 33.7 g/dL (ref 30.0–36.0)
MCV: 80.7 fL (ref 78.0–100.0)
PLATELETS: 396 10*3/uL (ref 150–400)
RBC: 4.3 MIL/uL (ref 3.87–5.11)
RDW: 13.7 % (ref 11.5–15.5)
WBC: 8.5 10*3/uL (ref 4.0–10.5)

## 2014-04-03 LAB — HCG, SERUM, QUALITATIVE: Preg, Serum: NEGATIVE

## 2014-04-03 SURGERY — LAPAROSCOPY, DIAGNOSTIC
Anesthesia: General | Site: Abdomen

## 2014-04-03 MED ORDER — SUCCINYLCHOLINE CHLORIDE 20 MG/ML IJ SOLN
INTRAMUSCULAR | Status: DC | PRN
  Administered 2014-04-03: 100 mg via INTRAVENOUS

## 2014-04-03 MED ORDER — HYDROMORPHONE HCL PF 1 MG/ML IJ SOLN
INTRAMUSCULAR | Status: AC
  Filled 2014-04-03: qty 1

## 2014-04-03 MED ORDER — HYDROMORPHONE HCL PF 1 MG/ML IJ SOLN
0.2500 mg | INTRAMUSCULAR | Status: AC | PRN
  Administered 2014-04-03 (×8): 0.5 mg via INTRAVENOUS

## 2014-04-03 MED ORDER — AMITRIPTYLINE HCL 75 MG PO TABS
75.0000 mg | ORAL_TABLET | Freq: Two times a day (BID) | ORAL | Status: DC
  Administered 2014-04-03 – 2014-04-04 (×2): 75 mg via ORAL
  Filled 2014-04-03 (×3): qty 1

## 2014-04-03 MED ORDER — FENTANYL CITRATE 0.05 MG/ML IJ SOLN
INTRAMUSCULAR | Status: AC
  Filled 2014-04-03: qty 2

## 2014-04-03 MED ORDER — LIDOCAINE HCL (PF) 2 % IJ SOLN
INTRAMUSCULAR | Status: DC | PRN
  Administered 2014-04-03: 75 mg via INTRADERMAL

## 2014-04-03 MED ORDER — LIDOCAINE HCL (CARDIAC) 20 MG/ML IV SOLN
INTRAVENOUS | Status: AC
  Filled 2014-04-03: qty 5

## 2014-04-03 MED ORDER — GLYCOPYRROLATE 0.2 MG/ML IJ SOLN
INTRAMUSCULAR | Status: AC
  Filled 2014-04-03: qty 3

## 2014-04-03 MED ORDER — FENTANYL CITRATE 0.05 MG/ML IJ SOLN
INTRAMUSCULAR | Status: AC
  Filled 2014-04-03: qty 4

## 2014-04-03 MED ORDER — ONDANSETRON HCL 4 MG/2ML IJ SOLN
INTRAMUSCULAR | Status: DC | PRN
Start: 2014-04-03 — End: 2014-04-03
  Administered 2014-04-03: 4 mg via INTRAVENOUS

## 2014-04-03 MED ORDER — FENTANYL CITRATE 0.05 MG/ML IJ SOLN
25.0000 ug | INTRAMUSCULAR | Status: DC | PRN
  Administered 2014-04-03 (×3): 50 ug via INTRAVENOUS

## 2014-04-03 MED ORDER — LISINOPRIL 20 MG PO TABS
20.0000 mg | ORAL_TABLET | Freq: Every day | ORAL | Status: DC
  Administered 2014-04-04: 20 mg via ORAL
  Filled 2014-04-03: qty 1

## 2014-04-03 MED ORDER — 0.9 % SODIUM CHLORIDE (POUR BTL) OPTIME
TOPICAL | Status: DC | PRN
  Administered 2014-04-03: 1000 mL

## 2014-04-03 MED ORDER — OXYCODONE-ACETAMINOPHEN 5-325 MG PO TABS
2.0000 | ORAL_TABLET | ORAL | Status: DC | PRN
  Administered 2014-04-03 – 2014-04-04 (×5): 2 via ORAL
  Filled 2014-04-03 (×5): qty 2

## 2014-04-03 MED ORDER — DEXAMETHASONE SODIUM PHOSPHATE 10 MG/ML IJ SOLN
INTRAMUSCULAR | Status: DC | PRN
  Administered 2014-04-03: 10 mg via INTRAVENOUS

## 2014-04-03 MED ORDER — MORPHINE SULFATE 2 MG/ML IJ SOLN
1.0000 mg | INTRAMUSCULAR | Status: DC | PRN
  Administered 2014-04-03 (×3): 2 mg via INTRAVENOUS
  Administered 2014-04-04 (×3): 4 mg via INTRAVENOUS
  Filled 2014-04-03: qty 2
  Filled 2014-04-03 (×2): qty 1
  Filled 2014-04-03: qty 2
  Filled 2014-04-03: qty 1
  Filled 2014-04-03: qty 2

## 2014-04-03 MED ORDER — HYDROMORPHONE HCL PF 1 MG/ML IJ SOLN: 0.2500 mg | INTRAMUSCULAR | Status: DC | PRN

## 2014-04-03 MED ORDER — DEXAMETHASONE SODIUM PHOSPHATE 10 MG/ML IJ SOLN
INTRAMUSCULAR | Status: AC
  Filled 2014-04-03: qty 1

## 2014-04-03 MED ORDER — BUPIVACAINE HCL (PF) 0.25 % IJ SOLN
INTRAMUSCULAR | Status: AC
  Filled 2014-04-03: qty 30

## 2014-04-03 MED ORDER — OXYCODONE-ACETAMINOPHEN 7.5-325 MG PO TABS: 2.0000 | ORAL_TABLET | ORAL | Status: DC | PRN

## 2014-04-03 MED ORDER — DEXTROSE 5 % IV SOLN
INTRAVENOUS | Status: AC
  Filled 2014-04-03: qty 2

## 2014-04-03 MED ORDER — ROCURONIUM BROMIDE 100 MG/10ML IV SOLN
INTRAVENOUS | Status: AC
  Filled 2014-04-03: qty 1

## 2014-04-03 MED ORDER — ONDANSETRON HCL 4 MG PO TABS: 4.0000 mg | ORAL_TABLET | Freq: Four times a day (QID) | ORAL | Status: DC | PRN

## 2014-04-03 MED ORDER — NEOSTIGMINE METHYLSULFATE 10 MG/10ML IV SOLN
INTRAVENOUS | Status: AC
  Filled 2014-04-03: qty 1

## 2014-04-03 MED ORDER — PROPOFOL 10 MG/ML IV BOLUS
INTRAVENOUS | Status: DC | PRN
  Administered 2014-04-03: 200 mg via INTRAVENOUS

## 2014-04-03 MED ORDER — HYDROMORPHONE HCL PF 2 MG/ML IJ SOLN
INTRAMUSCULAR | Status: AC
  Filled 2014-04-03: qty 1

## 2014-04-03 MED ORDER — GLYCOPYRROLATE 0.2 MG/ML IJ SOLN
INTRAMUSCULAR | Status: DC | PRN
  Administered 2014-04-03: 0.6 mg via INTRAVENOUS

## 2014-04-03 MED ORDER — POTASSIUM CHLORIDE IN NACL 20-0.45 MEQ/L-% IV SOLN
INTRAVENOUS | Status: DC
  Administered 2014-04-03 – 2014-04-04 (×2): via INTRAVENOUS
  Filled 2014-04-03 (×8): qty 1000

## 2014-04-03 MED ORDER — HEPARIN SODIUM (PORCINE) 5000 UNIT/ML IJ SOLN
5000.0000 [IU] | Freq: Once | INTRAMUSCULAR | Status: AC
  Administered 2014-04-03: 5000 [IU] via SUBCUTANEOUS
  Filled 2014-04-03: qty 1

## 2014-04-03 MED ORDER — FENTANYL CITRATE 0.05 MG/ML IJ SOLN
INTRAMUSCULAR | Status: DC | PRN
  Administered 2014-04-03 (×2): 100 ug via INTRAVENOUS
  Administered 2014-04-03: 150 ug via INTRAVENOUS
  Administered 2014-04-03: 100 ug via INTRAVENOUS

## 2014-04-03 MED ORDER — FLEET ENEMA 7-19 GM/118ML RE ENEM: 1.0000 | ENEMA | Freq: Once | RECTAL | Status: DC

## 2014-04-03 MED ORDER — HYDROCHLOROTHIAZIDE 25 MG PO TABS
25.0000 mg | ORAL_TABLET | Freq: Every day | ORAL | Status: DC
  Administered 2014-04-04: 25 mg via ORAL
  Filled 2014-04-03: qty 1

## 2014-04-03 MED ORDER — CLONAZEPAM 1 MG PO TABS
1.0000 mg | ORAL_TABLET | Freq: Three times a day (TID) | ORAL | Status: DC
  Administered 2014-04-03 – 2014-04-04 (×3): 1 mg via ORAL
  Filled 2014-04-03 (×3): qty 2

## 2014-04-03 MED ORDER — ACETAMINOPHEN 160 MG/5ML PO SOLN
650.0000 mg | Freq: Once | ORAL | Status: AC
  Administered 2014-04-03: 650 mg via ORAL
  Filled 2014-04-03: qty 20.3

## 2014-04-03 MED ORDER — PROPOFOL 10 MG/ML IV BOLUS
INTRAVENOUS | Status: AC
  Filled 2014-04-03: qty 20

## 2014-04-03 MED ORDER — LACTATED RINGERS IV SOLN
INTRAVENOUS | Status: DC
Start: 2014-04-03 — End: 2014-04-03
  Administered 2014-04-03 (×4): via INTRAVENOUS

## 2014-04-03 MED ORDER — ROCURONIUM BROMIDE 100 MG/10ML IV SOLN
INTRAVENOUS | Status: DC | PRN
  Administered 2014-04-03: 40 mg via INTRAVENOUS
  Administered 2014-04-03 (×3): 20 mg via INTRAVENOUS
  Administered 2014-04-03: 10 mg via INTRAVENOUS

## 2014-04-03 MED ORDER — ZOLPIDEM TARTRATE 5 MG PO TABS
5.0000 mg | ORAL_TABLET | Freq: Every evening | ORAL | Status: DC | PRN
  Administered 2014-04-03: 5 mg via ORAL
  Filled 2014-04-03: qty 1

## 2014-04-03 MED ORDER — HYDROMORPHONE HCL PF 1 MG/ML IJ SOLN
INTRAMUSCULAR | Status: DC | PRN
  Administered 2014-04-03 (×2): 1 mg via INTRAVENOUS

## 2014-04-03 MED ORDER — OXYCODONE HCL 5 MG PO TABS: 5.0000 mg | ORAL_TABLET | ORAL | Status: DC | PRN

## 2014-04-03 MED ORDER — ONDANSETRON HCL 4 MG/2ML IJ SOLN
4.0000 mg | Freq: Four times a day (QID) | INTRAMUSCULAR | Status: DC | PRN
  Administered 2014-04-03: 4 mg via INTRAVENOUS
  Filled 2014-04-03: qty 2

## 2014-04-03 MED ORDER — CEFOXITIN SODIUM 2 G IV SOLR
2.0000 g | INTRAVENOUS | Status: AC
  Administered 2014-04-03: 2 g via INTRAVENOUS

## 2014-04-03 MED ORDER — ONDANSETRON HCL 4 MG/2ML IJ SOLN
INTRAMUSCULAR | Status: AC
  Filled 2014-04-03: qty 2

## 2014-04-03 MED ORDER — CHLORHEXIDINE GLUCONATE 4 % EX LIQD: 1.0000 "application " | Freq: Once | CUTANEOUS | Status: DC

## 2014-04-03 MED ORDER — MIDAZOLAM HCL 2 MG/2ML IJ SOLN
INTRAMUSCULAR | Status: AC
  Filled 2014-04-03: qty 2

## 2014-04-03 MED ORDER — NEOSTIGMINE METHYLSULFATE 10 MG/10ML IV SOLN
INTRAVENOUS | Status: DC | PRN
  Administered 2014-04-03: 4 mg via INTRAVENOUS

## 2014-04-03 MED ORDER — BUPIVACAINE HCL (PF) 0.25 % IJ SOLN
INTRAMUSCULAR | Status: DC | PRN
  Administered 2014-04-03: 20 mL

## 2014-04-03 MED ORDER — LISINOPRIL-HYDROCHLOROTHIAZIDE 20-25 MG PO TABS: 1.0000 | ORAL_TABLET | Freq: Every morning | ORAL | Status: DC

## 2014-04-03 MED ORDER — MIDAZOLAM HCL 5 MG/5ML IJ SOLN
INTRAMUSCULAR | Status: DC | PRN
  Administered 2014-04-03: 2 mg via INTRAVENOUS

## 2014-04-03 MED ORDER — LACTATED RINGERS IR SOLN
Status: DC | PRN
  Administered 2014-04-03: 1000 mL

## 2014-04-03 MED ORDER — FENTANYL CITRATE 0.05 MG/ML IJ SOLN
INTRAMUSCULAR | Status: AC
  Filled 2014-04-03: qty 5

## 2014-04-03 MED ORDER — HEPARIN SODIUM (PORCINE) 5000 UNIT/ML IJ SOLN
5000.0000 [IU] | Freq: Three times a day (TID) | INTRAMUSCULAR | Status: DC
  Administered 2014-04-03 – 2014-04-04 (×2): 5000 [IU] via SUBCUTANEOUS
  Filled 2014-04-03 (×5): qty 1

## 2014-04-03 SURGICAL SUPPLY — 38 items
ADH SKN CLS APL DERMABOND .7 (GAUZE/BANDAGES/DRESSINGS) ×1
BENZOIN TINCTURE PRP APPL 2/3 (GAUZE/BANDAGES/DRESSINGS) IMPLANT
CANISTER SUCTION 2500CC (MISCELLANEOUS) ×3 IMPLANT
CLOSURE WOUND 1/2 X4 (GAUZE/BANDAGES/DRESSINGS)
DECANTER SPIKE VIAL GLASS SM (MISCELLANEOUS) IMPLANT
DERMABOND ADVANCED (GAUZE/BANDAGES/DRESSINGS) ×2
DERMABOND ADVANCED .7 DNX12 (GAUZE/BANDAGES/DRESSINGS) ×1 IMPLANT
DRAPE LAPAROSCOPIC ABDOMINAL (DRAPES) ×3 IMPLANT
ELECT REM PT RETURN 9FT ADLT (ELECTROSURGICAL) ×3
ELECTRODE REM PT RTRN 9FT ADLT (ELECTROSURGICAL) ×1 IMPLANT
GLOVE BIOGEL PI IND STRL 7.0 (GLOVE) ×3 IMPLANT
GLOVE BIOGEL PI INDICATOR 7.0 (GLOVE) ×6
GLOVE SURG SIGNA 7.5 PF LTX (GLOVE) ×3 IMPLANT
GLOVE SURG SS PI 6.5 STRL IVOR (GLOVE) ×12 IMPLANT
GOWN SPEC L4 XLG W/TWL (GOWN DISPOSABLE) IMPLANT
GOWN STRL REUS W/ TWL XL LVL3 (GOWN DISPOSABLE) ×3 IMPLANT
GOWN STRL REUS W/TWL LRG LVL3 (GOWN DISPOSABLE) ×3 IMPLANT
GOWN STRL REUS W/TWL XL LVL3 (GOWN DISPOSABLE) ×6
KIT BASIN OR (CUSTOM PROCEDURE TRAY) ×3 IMPLANT
SCISSORS LAP 5X35 DISP (ENDOMECHANICALS) ×3 IMPLANT
SET IRRIG TUBING LAPAROSCOPIC (IRRIGATION / IRRIGATOR) IMPLANT
SHEARS HARMONIC ACE PLUS 36CM (ENDOMECHANICALS) ×3 IMPLANT
SLEEVE ADV FIXATION 5X100MM (TROCAR) ×12 IMPLANT
SOLUTION ANTI FOG 6CC (MISCELLANEOUS) ×3 IMPLANT
STRIP CLOSURE SKIN 1/2X4 (GAUZE/BANDAGES/DRESSINGS) IMPLANT
SUT MON AB 5-0 PS2 18 (SUTURE) ×3 IMPLANT
SUT SILK 2 0 SH (SUTURE) ×9 IMPLANT
SUT VIC AB 4-0 PS2 27 (SUTURE) IMPLANT
TOWEL OR 17X26 10 PK STRL BLUE (TOWEL DISPOSABLE) ×3 IMPLANT
TRAY FOLEY CATH 14FRSI W/METER (CATHETERS) ×3 IMPLANT
TRAY LAP CHOLE (CUSTOM PROCEDURE TRAY) ×3 IMPLANT
TROCAR ADV FIXATION 5X100MM (TROCAR) ×3 IMPLANT
TROCAR BLADELESS OPT 5 75 (ENDOMECHANICALS) IMPLANT
TROCAR XCEL BLUNT TIP 100MML (ENDOMECHANICALS) ×3 IMPLANT
TROCAR XCEL NON-BLD 11X100MML (ENDOMECHANICALS) IMPLANT
TROCAR XCEL UNIV SLVE 11M 100M (ENDOMECHANICALS) IMPLANT
TUBING INSUFFLATION 10FT LAP (TUBING) ×3 IMPLANT
WATER STERILE IRR 1500ML POUR (IV SOLUTION) IMPLANT

## 2014-04-03 NOTE — Transfer of Care (Signed)
Immediate Anesthesia Transfer of Care Note  Patient: Jordan Velez  Procedure(s) Performed: Procedure(s): DIAGNOSTIC LAPAROSCOPY, REDUCTION AND REPAIR OF INTERNAL HERNIA (N/A)  Patient Location: PACU  Anesthesia Type:General  Level of Consciousness: awake, sedated and patient cooperative  Airway & Oxygen Therapy: Patient Spontanous Breathing and Patient connected to face mask oxygen  Post-op Assessment: Report given to PACU RN, Post -op Vital signs reviewed and stable and Patient moving all extremities X 4  Post vital signs: Reviewed and stable  Complications: No apparent anesthesia complications

## 2014-04-03 NOTE — Anesthesia Preprocedure Evaluation (Addendum)
Anesthesia Evaluation  Patient identified by MRN, date of birth, ID band Patient awake    Reviewed: Allergy & Precautions, H&P , Patient's Chart, lab work & pertinent test results, reviewed documented beta blocker date and time   Airway Mallampati: III TM Distance: >3 FB Neck ROM: full    Dental no notable dental hx.    Pulmonary  breath sounds clear to auscultation  Pulmonary exam normal       Cardiovascular hypertension, On Medications Rhythm:regular Rate:Normal     Neuro/Psych    GI/Hepatic   Endo/Other  Morbid obesity  Renal/GU      Musculoskeletal   Abdominal   Peds  Hematology   Anesthesia Other Findings Polypharmacy Opiate tolerance  Reproductive/Obstetrics                         Anesthesia Physical Anesthesia Plan  ASA: III  Anesthesia Plan: General   Post-op Pain Management:    Induction: Intravenous  Airway Management Planned: Oral ETT  Additional Equipment:   Intra-op Plan:   Post-operative Plan: Extubation in OR  Informed Consent: I have reviewed the patients History and Physical, chart, labs and discussed the procedure including the risks, benefits and alternatives for the proposed anesthesia with the patient or authorized representative who has indicated his/her understanding and acceptance.   Dental Advisory Given and Dental advisory given  Plan Discussed with: CRNA and Surgeon  Anesthesia Plan Comments: (  Discussed general anesthesia, including possible nausea, instrumentation of airway, sore throat,pulmonary aspiration, etc. I asked if the were any outstanding questions, or  concerns before we proceeded. )        Anesthesia Quick Evaluation

## 2014-04-03 NOTE — Op Note (Addendum)
NAME:  Jordan Velez, MCBRIEN                ACCOUNT NO.:  [id number]  MEDICAL RECORD NO.:  [id number]  LOCATION:  [facility]                         FACILITY:  [facility]  PHYSICIAN:  Fenton Malling. Lucia Gaskins, M.D.  DATE OF BIRTH:  1971/08/01  DATE OF PROCEDURE:  04/03/2014 DATE OF DISCHARGE:                              OPERATIVE REPORT   PREOPERATIVE DIAGNOSIS:  Abdominal pain, possible internal hernia.  POSTOPERATIVE DIAGNOSIS:  Abdominal pain with internal hernia at jejuno-jejunostomy, 5 cm left ovarian cyst, adhesions of omentum to anterior abdominal wall, an adhesive band down to the pelvis.  PROCEDURE:  Laparoscopic exploration with reduction of internal hernia (I spent 1 hour reducing hernia) and closure of hernia defect, and lysis of adhesive band.  [photos in chart]  SURGEON:  Fenton Malling. Lucia Gaskins, MD  FIRST ASSISTANT:  None.  ANESTHESIA:  General endotracheal, supervised by Juliann Pulse, M.D.  ESTIMATED BLOOD LOSS:  Minimal.  The local anesthetic was 20 mL of 0.25% Marcaine.  COMPLICATIONS:  None.  INDICATION FOR PROCEDURE:  Jordan Velez is a 43 year old white female who underwent a laparoscopic Roux-en-Y gastric bypass by Dr. Alphonsa Overall on June 14, 2003.  She had not been seen in the office in a number years, but developed worsening abdominal pain.  She had an upper endoscopy by Dr. Gala Romney about 1 month ago, which was negative with no evidence of ulcer disease.  Her weight was 223 pounds with BMI 36.  She underwent a CT scan on March 03, 2014, that was originally reported as normal by Dr. Daryll Brod. However, it looks like the CT was reviewed with Dr. Thornton Papas, raised the question of an internal hernia.  She saw Dr. Kaylyn Lim on April 11, 2014, in the office and I happened to be in the office the same time.  We reviewed her physical exam, history, and CT scan, and thought she was at risk for having an internal hernia, and felt should be best with laparoscopic exploration.  The indications,  potential risks of surgery include bleeding, infection, nerve injury, bowel injury, and open surgery.  OPERATIVE NOTE:  The patient was taken to OR room #1 at [facility].  The patient was placed in a supine position, has a Foley catheter in place.  Both arms were tucked by her side.  She underwent a general endotracheal anesthetic and given 2 g of cefoxitin at the initiation of procedure.  A time-out was held and surgical checklist run.  I accessed her abdominal cavity through an infraumbilical incision using an Hasson trocar.  The trocar was secured with a 2-0 Vicryl suture.  I placed 4 additional 5 mm balloon trocars:  one in the right upper quadrant, one in the right lower quadrant, one in the left upper quadrant, and one in the left lower quadrant.  First, she did have adhesions of omentum adhered to the anterior abdominal wall at the umbilicus.  I took these down with a Harmonic Scalpel.  She had a single dense fibrous band from the umbilicus down to the pelvic area and I divided this with a Harmonic Scalpel.  I explored her abdomen, her right and left lobes of the liver unremarkable.  Her  gastric pouch and gastrojejunostomy looked okay.  In her pelvis, her right tube and ovary were absent, her left ovary appeared to be about 5 cm in diameter and it matched the size of the uterus.  Photos were taken.  I started running her small bowel from the terminal ileum, identified the appendix.  Clearly, there was some abnormality of small bowel where it was in through an internal hernia and the mesentery of the small bowel.  I spent probably better than an hour, reducing this internal hernia and straightening out her small bowel.  I ran the small bowel at least 3 separate times.  I then closed the hernia defect at the jejuno-jejunostomy with interrupted 2-0 silk sutures x3.  The defect was at the jejunojejunostomy, but the bowel itself was not injured.   It was a chronic internal hernia without significant bowel  obstruction.  After the hernia been repaired, I ran the bowel one  more time to make sure there is nothing twisting about the bowel where she was not.  I infiltrated each wound with 0.25% Marcaine using a total about 20 mL.  I closed the umbilical incision with a 0 Vicryl suture.  I closed the skin, each suture with a 5-0 Vicryl suture.  I placed Dermabond on the wound.  She was transferred to recovery room in good condition and her Foley was removed.   Fenton Malling. Lucia Gaskins, M.D., FACS   DHN/MEDQ  D:  04/03/2014  T:  04/03/2014  Job:  [id number]  cc:   Monia Sabal. Corinna Capra, M.D. Fax: [phone]  Lennie Odor, Joiner Fax: [phone]  R. Garfield Cornea, MD FACP [facility] [address] [place] [address]  Domingo Pulse, M.D. Fax: [phone]

## 2014-04-03 NOTE — Anesthesia Postprocedure Evaluation (Signed)
  Anesthesia Post-op Note  Patient: Jordan Velez  Procedure(s) Performed: Procedure(s): DIAGNOSTIC LAPAROSCOPY, REDUCTION AND REPAIR OF INTERNAL HERNIA (N/A) Patient is awake and responsive. Pain and nausea are reasonably well controlled. Vital signs are stable and clinically acceptable. Oxygen saturation is clinically acceptable. There are no apparent anesthetic complications at this time. Patient is ready for discharge.

## 2014-04-03 NOTE — Interval H&P Note (Signed)
History and Physical Interval Note:  04/03/2014 8:39 AM  Jordan Velez  has presented today for surgery, with the diagnosis of abdominal pain with possible internal hernia  The various methods of treatment have been discussed with the patient and family.  Reviewed Dr. Earlie Server history and PE, discussed findings with patient, husband is here with the patient.  After consideration of risks, benefits and other options for treatment, the patient has consented to  Procedure(s): LAPAROSCOPY DIAGNOSTIC (N/A) as a surgical intervention .  The patient's history has been reviewed, patient examined, no change in status, stable for surgery.  I have reviewed the patient's chart and labs.  Questions were answered to the patient's satisfaction.     Augusto Deckman H

## 2014-04-03 NOTE — H&P (View-Only) (Signed)
Chief Complaint:  Abdominal pain since April 15   History of Present Illness:  Jordan Velez is an 43 y.o. 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 Lucia Gaskins.  She has maintained about a 50 lb weight loss since her surgery.  On her about April 15 she began having significant left-sided abdominal pain. She had a history of a ovarian cyst and underwent surgery to remove the cyst in her fallopian tubes but the pain has continued. She does not tolerate a solid diet but is able to make maintained on a liquid diet. CT scan was performed and he can which was red as showing ovarian cyst but did not show anything else. However further review there is some swirling seen in the mesentery suggestive of a possible internal hernia. This was reviewed by me today and we discussed laparoscopy. Dr. Lucia Gaskins was in the office with me and we were trying to her on for him to do and next few days his I will be unavailable.  She has been able to tolerate a liquid diet but has been having fairly significant left upper quadrant abdominal pain. This has not been accompanied by nausea or vomiting.  Past Medical History  Diagnosis Date  . Endometriosis   . Pelvic pain   . Ovarian cyst     RECURRENT  . IC (interstitial cystitis)   . Migraine   . Chronic back pain   . Chronic nausea     POST GASTRIC BYPASS  . S/P gastric bypass     2004  . Hypertension   . Depression   . Anemia   . H/O hiatal hernia   . Self-catheterizes urinary bladder   . Pulse fast     Past Surgical History  Procedure Laterality Date  . Hysteroscopy w/d&c  02-10-2003  . Roux-en-y procedure  06-14-2003  . Cystourethroscopy/  hydodistention/ bladder bx  01-02-2006  . Endometrial ablation w/ novasure  2007  . Laparoscopy N/A 09/11/2013    Procedure: LAPAROSCOPY DIAGNOSTIC , lysis of adhesions, ablation of endometrious;  Surgeon: Luz Lex, MD;  Location: [facility];  Service: Gynecology;   Laterality: N/A;  . Salpingoophorectomy Right 09/11/2013    Procedure: SALPINGO OOPHORECTOMY;  Surgeon: Luz Lex, MD;  Location: [facility];  Service: Gynecology;  Laterality: Right;  . Tens unit    . Esophagogastroduodenoscopy N/A 02/23/2014    Procedure: ESOPHAGOGASTRODUODENOSCOPY (EGD);  Surgeon: Daneil Dolin, MD;  [facility];  Service: Endoscopy;  Laterality: N/A;  1:15-moved to [place] to notify pt  . Gastric bypass    . Dilation and curettage of uterus      Current Outpatient Prescriptions  Medication Sig Dispense Refill  . acetaminophen (TYLENOL) 500 MG tablet Take 500 mg by mouth daily as needed for mild pain or moderate pain.      Marland Kitchen amitriptyline (ELAVIL) 150 MG tablet Take 75 mg by mouth 2 (two) times daily.       . calcium carbonate (TUMS - DOSED IN MG ELEMENTAL CALCIUM) 500 MG chewable tablet Chew 2 tablets by mouth 2 (two) times daily as needed for indigestion or heartburn.      . clonazePAM (KLONOPIN) 1 MG tablet Take 1 mg by mouth 3 (three) times daily.      Marland Kitchen lisinopril-hydrochlorothiazide (PRINZIDE,ZESTORETIC) 20-25 MG per tablet Take 1 tablet by mouth every morning.      . ondansetron (ZOFRAN) 4 MG tablet Take  1 tablet (4 mg total) by mouth every 8 (eight) hours as needed for nausea or vomiting.  12 tablet  0  . oxycodone (OXY-IR) 5 MG capsule Take 1 capsule (5 mg total) by mouth every 6 (six) hours as needed for pain.  30 capsule  0  . oxyCODONE (ROXICODONE) 5 MG immediate release tablet Take 1 tablet (5 mg total) by mouth every 4 (four) hours as needed for severe pain.  20 tablet  0  . PRESCRIPTION MEDICATION 23 mLs by Intracatheter route daily as needed (for bladder pain). Sodium Bicarb, Heparin, Lidocaine: combination inserted thru catheter into bladder.      . sulfamethoxazole-trimethoprim (BACTRIM DS) 800-160 MG per tablet Take 1 tablet by mouth as needed.      . zolpidem (AMBIEN) 10 MG tablet Take 10 mg by mouth at bedtime as  needed for sleep.        No current facility-administered medications for this visit.   Codeine and Nsaids Family History  Problem Relation Age of Onset  . Colon cancer Neg Hx   . Heart disease Maternal Grandmother    Social History:   reports that she has never smoked. She has never used smokeless tobacco. She reports that she does not drink alcohol or use illicit drugs.   REVIEW OF SYSTEMS : Positive for interstitial cystitis ; otherwise negative  Physical Exam:   Blood pressure 126/74, pulse 108, temperature 98 F (36.7 C), height 5\' 6"  (1.676 m), weight 223 lb (101.152 kg). Body mass index is 36.01 kg/(m^2).  Gen:  WDWN white female NAD  Neurological: Alert and oriented to person, place, and time. Motor and sensory function is grossly intact  Head: Normocephalic and atraumatic.  Eyes: Conjunctivae are normal. Pupils are equal, round, and reactive to light. No scleral icterus.  Neck: Normal range of motion. Neck supple. No tracheal deviation or thyromegaly present.  Cardiovascular:  SR without murmurs or gallops.  No carotid bruits Breast:  Not examined Respiratory: Effort normal.  No respiratory distress. No chest wall tenderness. Breath sounds normal.  No wheezes, rales or rhonchi.  Abdomen:  Fullness in the left upper quadrant which is a spot of her discomfort. GU:  Not examined Musculoskeletal: Normal range of motion. Extremities are nontender. No cyanosis, edema or clubbing noted Lymphadenopathy: No cervical, preauricular, postauricular or axillary adenopathy is present Skin: Skin is warm and dry. No rash noted. No diaphoresis. No erythema. No pallor. Pscyh: Normal mood and affect. Behavior is normal. Judgment and thought content normal.   LABORATORY RESULTS: No results found for this or any previous visit (from the past 48 hour(s)).   RADIOLOGY RESULTS: No results found.  Problem List: Patient Active Problem List   Diagnosis Date Noted  . Abdominal pain,  unspecified site 02/11/2014  . Fatty liver 02/11/2014  . ANXIETY 10/28/2006  . DEPRESSION 10/28/2006  . CONSTIPATION NOS 10/28/2006  . IBS 10/28/2006  . OVERACTIVE BLADDER 10/28/2006  . UTERINE POLYP 10/28/2006  . LOW BACK PAIN 10/28/2006  . URINARY INCONTINENCE 10/28/2006    Assessment & Plan: Abdominal pain with possible internal hernia noted on a CT scan performed had any scan on 02/26/2014. Will set up for laparoscopic evaluation per Dr. Lucia Gaskins or Dr. Excell Seltzer who are on call this Saturday.      Matt B. Hassell Done, MD, [facility] [phone] beeper [phone]  04/01/2014 12:15 PM

## 2014-04-04 MED ORDER — OXYCODONE-ACETAMINOPHEN 5-325 MG PO TABS: 1.0000 | ORAL_TABLET | Freq: Four times a day (QID) | ORAL | Status: DC | PRN

## 2014-04-04 NOTE — Progress Notes (Signed)
Assessment unchanged. Pt and husband verbalized understanding of dc instructions through teach back. Script given as provided by MD. Discharged via wc to front entrance to meet awaiting vehicle to carry home. Accompanied by NT and husband.

## 2014-04-04 NOTE — Discharge Summary (Signed)
Physician Discharge Summary  Patient ID:  Jordan Velez  MRN: [id number]  DOB/AGE: Jan 06, 1971 43 y.o.  Admit date: 04/03/2014 Discharge date: 04/04/2014  Discharge Diagnoses:  1.  Internal hernia with small bowel through hernia  2.  S/P RY Gastric Bypass for morbid obesity - 2004 - D. Anyae Griffith  Current weight - 223, BMI - 36  3.  Chronic pelvic pain   Sees Dr. Marilynn Rail 4.  Left ovarian cyst  [photo in chart]  Followed by Dr. Daron Offer. 5.  Questionable endometrial tissue  Followed by Dr. Daron Offer - for endometrial biopsy later this week 6.  Chronic pain issues  On a baseline of about 4 Percocet (5 mg) per day  With her abdominal pain over the last 3 months, she was taking as many as 10 Percocet a day. 7.  TENS unit for pelvic pain control  Placed Jan 2015 by Dr. Amalia Hailey.   Active Problems:   Internal hernia  Operation: Procedure(s):  DIAGNOSTIC LAPAROSCOPY, REDUCTION AND REPAIR OF INTERNAL HERNIA on 04/03/2014 - D. [facility]  Discharged Condition: good  Hospital Course: Jordan Velez is an 43 y.o. female whose primary care physician is REDMON,NOELLE, PA-C and who was admitted 04/03/2014 with a chief complaint of abdominal pain which began in April 2015.  She had a prior gastric bypass by Dr. Keturah Barre. Arch Methot in 2004 and had successful weight loss of more than 100 pounds.  She has gained some of that weight back.  Because of her abdominal pain, she had an upper endo by Dr. Buford Dresser on 23 February 2014, which was negative.  She had an abdominal CT scan on 03/03/2104, which originally was read a normal, but on review suggested a possible internal hernia.  She saw Dr. Hassell Done in our office on 04/01/2014 and he reviewed her case with me.  He was worried about her internal hernia (per her symptoms and what was seen on CT scan) and we discussed proceeding with surgery.  She was brought to the operating room on 04/03/2014 and underwent  DIAGNOSTIC LAPAROSCOPY, REDUCTION AND REPAIR OF INTERNAL HERNIA.  She is now one day  post op.  She feels better, but now has pain in her right lower abdomen.  I think that this is incisional.  She is tolerating oral diet and is ready to go home. Her husband is in the room with her.  The discharge instructions were reviewed with the patient.  Consults: None  Significant Diagnostic Studies: Results for orders placed during the hospital encounter of 04/03/14  CBC      Result Value Ref Range   WBC 8.5  4.0 - 10.5 K/uL   RBC 4.30  3.87 - 5.11 MIL/uL   Hemoglobin 11.7 (*) 12.0 - 15.0 g/dL   HCT 34.7 (*) 36.0 - 46.0 %   MCV 80.7  78.0 - 100.0 fL   MCH 27.2  26.0 - 34.0 pg   MCHC 33.7  30.0 - 36.0 g/dL   RDW 13.7  11.5 - 15.5 %   Platelets 396  150 - 400 K/uL  BASIC METABOLIC PANEL      Result Value Ref Range   Sodium 132 (*) 137 - 147 mEq/L   Potassium 4.4  3.7 - 5.3 mEq/L   Chloride 93 (*) 96 - 112 mEq/L   CO2 24  19 - 32 mEq/L   Glucose, Bld 116 (*) 70 - 99 mg/dL   BUN 15  6 - 23 mg/dL   Creatinine, Ser 0.71  0.50 - 1.10 mg/dL   Calcium 10.2  8.4 - 10.5 mg/dL   GFR calc non Af Amer >90  >90 mL/min   GFR calc Af Amer >90  >90 mL/min   Anion gap 15  5 - 15  HCG, SERUM, QUALITATIVE      Result Value Ref Range   Preg, Serum NEGATIVE  NEGATIVE   US Pelvis Complete  03/15/2014   CLINICAL DATA:  Pain.  History of endometriosis.  EXAM: TRANSABDOMINAL ULTRASOUND OF PELVIS  TECHNIQUE: Transabdominal ultrasound examination of the pelvis was performed including evaluation of the uterus, ovaries, adnexal regions, and pelvic cul-de-sac.  COMPARISON:  CT 02/26/2014.  FINDINGS: Uterus  Measurements: 7.6 x 4.2 x 4.9 cm. No fibroids or other mass visualized.  Endometrium  Thickness: 7 mm. Complex 1.5 x 1.2 cm fluid collection in the endometrial cavity. The contours of this fluid collection are irregular. Possible tiny solid nodular components are present. Differential diagnosis includes endometritis, endometrial hemorrhage secondary to endometrial pathology including early endometrial  malignancy, and atypical early pregnancy.  Right ovary  Not visualized. Right salpingectomy and oophorectomy by patient history.  Left ovary  Measurements: 2.6 x 1.6 x 1.3 cm. Normal appearance/no adnexal mass. Previously identified left adnexal cyst on CT of 02/26/2014 not identified.  Other findings:  No free fluid  IMPRESSION: 1. Complex irregular 1.5 x 1.2 cm fluid collection in the endometrial cavity. Possible tiny solid nodular components are present. Differential diagnosis includes endometritis,endometrial hemorrhage secondary to endometrial pathology including early endometrial malignancy, and atypical early pregnancy. 2. Previously identified left adnexal cyst on CT of 02/26/2014 is not identified. 3. Right ovary is not visualized. This is consistent with patient's history of right salpingectomy and oophorectomy.   Electronically Signed   By: Marcello Moores  Register   On: 03/15/2014 09:49   Discharge Exam:  Filed Vitals:   04/04/14 0901  BP: 155/56  Pulse:   Temp:   Resp:    General: Obese WF who is alert and generally healthy appearing.  Lungs: Clear to auscultation and symmetric breath sounds. Heart:  RRR. No murmur or rub. Abdomen: Soft.  Normal bowel sounds. Her wounds looks good.  Discharge Medications:     Medication List         acetaminophen 500 MG tablet  Commonly known as:  TYLENOL  Take 500 mg by mouth daily as needed for mild pain or moderate pain.     AMBIEN 10 MG tablet  Generic drug:  zolpidem  Take 10 mg by mouth at bedtime as needed for sleep.     amitriptyline 150 MG tablet  Commonly known as:  ELAVIL  Take 75 mg by mouth 2 (two) times daily.     calcium carbonate 500 MG chewable tablet  Commonly known as:  TUMS - dosed in mg elemental calcium  Chew 2 tablets by mouth 2 (two) times daily.     clonazePAM 1 MG tablet  Commonly known as:  KLONOPIN  Take 1 mg by mouth 3 (three) times daily.     lisinopril-hydrochlorothiazide 20-25 MG per tablet  Commonly  known as:  PRINZIDE,ZESTORETIC  Take 1 tablet by mouth every morning.     ondansetron 4 MG tablet  Commonly known as:  ZOFRAN  Take 1 tablet (4 mg total) by mouth every 8 (eight) hours as needed for nausea or vomiting.     oxyCODONE 5 MG immediate release tablet  Commonly known as:  ROXICODONE  Take 1 tablet (5 mg total) by mouth  every 4 (four) hours as needed for severe pain.     oxyCODONE-acetaminophen 7.5-325 MG per tablet  Commonly known as:  PERCOCET  Take 2 tablets by mouth every 4 (four) hours as needed for pain.     oxyCODONE-acetaminophen 5-325 MG per tablet  Commonly known as:  ROXICET  Take 1 tablet by mouth every 6 (six) hours as needed for severe pain.     PRESCRIPTION MEDICATION  23 mLs by Intracatheter route daily as needed (for bladder pain). Sodium Bicarb, Heparin, Lidocaine: combination inserted thru catheter into bladder.     sulfamethoxazole-trimethoprim 800-160 MG per tablet  Commonly known as:  BACTRIM DS  Take 1 tablet by mouth 2 (two) times daily as needed. Prevent infection        Disposition: 01-Home or Self Care      Discharge Instructions   Diet - low sodium heart healthy    Complete by:  As directed      Increase activity slowly    Complete by:  As directed           She is to see Dr. Corinna Capra this week for possible endometrial biopsy. She is to see Dr. Amalia Hailey this week for her follow up with him. I gave her Percocet 7.5 mg, #80, to use for now, until she gets back to her baseline pain management. She'll see me back in 2 to 3 weeks.  Signed: Alphonsa Overall, M.D., [facility] Office:  [phone]  04/04/2014, 11:33 AM

## 2014-04-05 ENCOUNTER — Encounter (HOSPITAL_COMMUNITY): Payer: Self-pay | Admitting: Surgery

## 2014-04-06 ENCOUNTER — Other Ambulatory Visit: Payer: Self-pay | Admitting: Obstetrics and Gynecology

## 2014-04-06 ENCOUNTER — Telehealth (INDEPENDENT_AMBULATORY_CARE_PROVIDER_SITE_OTHER): Payer: Self-pay

## 2014-04-06 NOTE — Telephone Encounter (Addendum)
Pt had hernia repair by Dr. Lucia Gaskins on 04/03/14.  She is calling today to report that there is a large reddish area around one of the small incision sites.  She says it is "under the skin".  No heat at the incision site, no fever or chills or pain.  I advised the pt to keep an eye on the area and if it becomes worse or there is any drainage from the incision to call us immediately so she can be seen in urgent office. Pt agreed.

## 2014-04-13 ENCOUNTER — Encounter (HOSPITAL_COMMUNITY): Payer: Self-pay | Admitting: Surgery

## 2014-04-14 ENCOUNTER — Ambulatory Visit (INDEPENDENT_AMBULATORY_CARE_PROVIDER_SITE_OTHER): Payer: Commercial Managed Care - HMO | Admitting: Surgery

## 2014-04-14 ENCOUNTER — Encounter (INDEPENDENT_AMBULATORY_CARE_PROVIDER_SITE_OTHER): Payer: Self-pay | Admitting: Surgery

## 2014-04-14 VITALS — BP 134/86 | HR 111 | Temp 98.0°F | Resp 18 | Ht 65.0 in | Wt 222.0 lb

## 2014-04-14 DIAGNOSIS — Z9884 Bariatric surgery status: Secondary | ICD-10-CM

## 2014-04-14 DIAGNOSIS — K458 Other specified abdominal hernia without obstruction or gangrene: Secondary | ICD-10-CM

## 2014-04-14 NOTE — Progress Notes (Signed)
[place], MD,  [address]  [place], [place]    [place] Phone:  [phone] FAX:  [phone]   Re:   JESSEE NEWNAM DOB:   [birth year] MRN:   [id number]  ASSESSMENT AND PLAN: 1. Internal hernia with small bowel through hernia - repaired 04/03/2014 - D. Lucia Gaskins  She comes with complaints of abdominal pain - right sided. This is different than before surgery.  She wanted me to give her some alprazolam (that was started by Dr. Amalia Hailey), but I am hesitant to add to her med list.  She'll see me back in 3 months.  2. S/P RY Gastric Bypass for morbid obesity - 2004 - D. Illeana Edick   Weight in July 2015 - 223, BMI - 36  3. Chronic pelvic pain - IC  Sees Dr. Marilynn Rail   She saw him last week.  He gave her some Alprazolam - and she has not been able to refill it through his office. 4. Left ovarian cyst [photo in chart]   She is for a hysterectomy 05/06/2014 by Dr. Corinna Capra  5. Questionable endometrial tissue   Meribeth said that it could be malignant  See #4 6. Chronic pain issues   On a baseline of about 4 Oxycodone (5 mg) per day   With her abdominal pain over the last 3 months, she was taking as many as 10 Percocet a day.   It is unclear to me how many oxycodone she is taking now. 7. TENS unit for pelvic pain control   Placed Jan 2015 by Dr. Amalia Hailey. 8.  Scoliosis  But sees no ortho doctor  HISTORY OF PRESENT ILLNESS: Chief Complaint  Patient presents with  . Routine Post Op    hernia    KALYN HOFSTRA is a 43 y.o. (DOB: [birth year]) white  female who is a patient of REDMON,NOELLE, PA-C and comes to me today for follow up of laparoscopic exploration for an internal hernia.  She has a list of problems: 1)  She is not sleeping 2)  Now she has right sided abdominal pain, unrelated to food.  This is on the opposite side of her abdomen from her pre op abdominal pain. 3)  She feels fat around her midsection 4)  She is worried about her  umbilical incision 5)  She thinks that her back is acting up 6)  She is having a lot of trouble with her bladder.  She caths herself t4 to 5 times per week to instill meds for her UC.  Now she is cathing herself 4 to 5 per day because she cannot urinate and it hurts. 7)  It hurst when she swallows anything solid.  So she is on water and Ensure.  Of note, her weight is unchanged.  She did have an endoscopy by Dr. Buford Dresser 02/23/2014 which was negative.  Despite her complaints of not being able to eat, she has not had a weight change. 8)  She has stress ulcers in her mouth  It is difficult to separate real problems that Ms. Wison is having from the her chronic problems.  She is to get a hysterectomy by Dr. Corinna Capra on 05/06/2014.  She saw Dr. Amalia Hailey last week, went over her bladder problems with him, and he gave her some Xanax.  Unfortunately, he did not seem to give her enough. We talked about seeing a psychiatrist for anxiety, but she is not seeing anyone right now.  She  is a very difficult patient.   Past Medical History  Diagnosis Date  . Endometriosis   . Pelvic pain   . Ovarian cyst     RECURRENT  . IC (interstitial cystitis)   . Migraine   . Chronic back pain   . Chronic nausea     POST GASTRIC BYPASS  . S/P gastric bypass     2004  . Hypertension   . Depression   . Anemia   . H/O hiatal hernia   . Self-catheterizes urinary bladder   . Pulse fast     "with pain"   SOCIAL HISTORY: Married.  PHYSICAL EXAM: BP 134/86  Pulse 111  Temp(Src) 98 F (36.7 C)  Resp 18  Ht 5\' 5"  (1.651 m)  Wt 222 lb (100.699 kg)  BMI 36.94 kg/m2  LMP 04/02/2007  General: WN obese wF who is alert.  HEENT: Normal. Pupils equal.  Neck: Supple. No mass.  No thyroid mass.   Lymph Nodes:  No supraclavicular or cervical nodes. Lungs: Clear to auscultation and symmetric breath sounds. Heart:  RRR. No murmur or rub. Abdomen: Soft.  No tenderness. No hernia. Normal bowel sounds.  Incisions look okay  Though  she points to areas that hurt, I don't see much from a physical exam standpoint.  I tried to reassure her. Psychiatric: She is very stressed by her surgeries, her chronic illness, it sounds like she gets little support from her husband.   DATA REVIEWED: Epic notes and labs.  Alphonsa Overall, MD,  [facility], [address]  [place], [place]    [place] Phone:  [phone] FAX:  [phone]

## 2014-04-21 ENCOUNTER — Encounter (INDEPENDENT_AMBULATORY_CARE_PROVIDER_SITE_OTHER): Payer: Commercial Managed Care - HMO | Admitting: Surgery

## 2014-05-04 ENCOUNTER — Encounter (HOSPITAL_COMMUNITY): Payer: Self-pay

## 2014-05-04 ENCOUNTER — Encounter (HOSPITAL_COMMUNITY)
Admission: RE | Admit: 2014-05-04 | Discharge: 2014-05-04 | Disposition: A | Discharge status: Home / Self Care | Payer: Medicare HMO | Source: Ambulatory Visit | Attending: Obstetrics and Gynecology | Admitting: Obstetrics and Gynecology

## 2014-05-04 ENCOUNTER — Encounter (INDEPENDENT_AMBULATORY_CARE_PROVIDER_SITE_OTHER): Payer: Self-pay

## 2014-05-04 DIAGNOSIS — N83209 Unspecified ovarian cyst, unspecified side: Secondary | ICD-10-CM | POA: Diagnosis not present

## 2014-05-04 DIAGNOSIS — N925 Other specified irregular menstruation: Secondary | ICD-10-CM | POA: Diagnosis not present

## 2014-05-04 DIAGNOSIS — G8929 Other chronic pain: Secondary | ICD-10-CM | POA: Diagnosis not present

## 2014-05-04 DIAGNOSIS — N949 Unspecified condition associated with female genital organs and menstrual cycle: Secondary | ICD-10-CM | POA: Diagnosis present

## 2014-05-04 DIAGNOSIS — Z9884 Bariatric surgery status: Secondary | ICD-10-CM | POA: Diagnosis not present

## 2014-05-04 DIAGNOSIS — D279 Benign neoplasm of unspecified ovary: Secondary | ICD-10-CM | POA: Diagnosis not present

## 2014-05-04 DIAGNOSIS — N803 Endometriosis of pelvic peritoneum, unspecified: Secondary | ICD-10-CM | POA: Diagnosis not present

## 2014-05-04 DIAGNOSIS — I10 Essential (primary) hypertension: Secondary | ICD-10-CM | POA: Diagnosis not present

## 2014-05-04 DIAGNOSIS — N831 Corpus luteum cyst of ovary, unspecified side: Secondary | ICD-10-CM | POA: Diagnosis not present

## 2014-05-04 DIAGNOSIS — IMO0002 Reserved for concepts with insufficient information to code with codable children: Secondary | ICD-10-CM | POA: Diagnosis not present

## 2014-05-04 DIAGNOSIS — Z885 Allergy status to narcotic agent status: Secondary | ICD-10-CM | POA: Diagnosis not present

## 2014-05-04 DIAGNOSIS — D649 Anemia, unspecified: Secondary | ICD-10-CM | POA: Diagnosis not present

## 2014-05-04 DIAGNOSIS — N3010 Interstitial cystitis (chronic) without hematuria: Secondary | ICD-10-CM | POA: Diagnosis not present

## 2014-05-04 LAB — CBC
HEMATOCRIT: 32.4 % — AB (ref 36.0–46.0)
HEMOGLOBIN: 10.3 g/dL — AB (ref 12.0–15.0)
MCH: 26.8 pg (ref 26.0–34.0)
MCHC: 31.8 g/dL (ref 30.0–36.0)
MCV: 84.2 fL (ref 78.0–100.0)
Platelets: 343 10*3/uL (ref 150–400)
RBC: 3.85 MIL/uL — AB (ref 3.87–5.11)
RDW: 15.4 % (ref 11.5–15.5)
WBC: 7.7 10*3/uL (ref 4.0–10.5)

## 2014-05-04 LAB — BASIC METABOLIC PANEL
Anion gap: 9 (ref 5–15)
BUN: 17 mg/dL (ref 6–23)
CHLORIDE: 100 meq/L (ref 96–112)
CO2: 26 meq/L (ref 19–32)
Calcium: 9.9 mg/dL (ref 8.4–10.5)
Creatinine, Ser: 0.88 mg/dL (ref 0.50–1.10)
GFR calc Af Amer: >90 mL/min (ref >90)
GFR, EST NON AFRICAN AMERICAN: 79 mL/min — AB (ref >90)
GLUCOSE: 86 mg/dL (ref 70–99)
POTASSIUM: 4.6 meq/L (ref 3.7–5.3)
SODIUM: 135 meq/L — AB (ref 137–147)

## 2014-05-04 NOTE — Patient Instructions (Signed)
[place]  05/04/2014   Your procedure is scheduled on:  05/06/14  Enter through the Main Entrance of [facility] at Post up the phone at the desk and dial [phone].   Call this number if you have problems the morning of surgery: [phone]   Remember:   Do not eat food:After Midnight.  Do not drink clear liquids: After Midnight.  Take these medicines the morning of surgery with A SIP OF WATER: Blood pressure medication, Elavil and Klonopin   Do not wear jewelry, make-up or nail polish.  Do not wear lotions, powders, or perfumes. You may wear deodorant.  Do not shave 48 hours prior to surgery.  Do not bring valuables to the hospital.  [facility] is not   responsible for any belongings or valuables brought to the hospital.  Contacts, dentures or bridgework may not be worn into surgery.  Leave suitcase in the car. After surgery it may be brought to your room.  For patients admitted to the hospital, checkout time is 11:00 AM the day of              discharge.   Patients discharged the day of surgery will not be allowed to drive             home.  Name and phone number of your driver: NA  Special Instructions:      Please read over the following fact sheets that you were given:   Surgical Site Infection Prevention

## 2014-05-05 NOTE — H&P (Addendum)
Jordan Velez presents today for a preop evaluation of hysterectomy and left salpingo-oophorectomy.  Her history is significant for history of endometriosis and recurrent ovarian cysts.  She had a recent right salpingo-oophorectomy and lysis of adhesions and ablation of endometriosis in December of this last year and had some improvement for about 3-6 months, is beginning to have worsening pain with a recurrent left ovarian cyst and endometrial pain.  In the past, she has had a NovaSure endometrial ablation for menorrhagia.  It did help for a while but at this time she desires definitive surgical intervention and requests hysterectomy with removal of the left tube and ovary which she knows will ultimately make her menopausal.  Ultimately it will help with endometriosis, and she is willing to accept menopausal type of changes.  Jordan Velez recently had surgery by Dr. Alphonsa Overall for internal hernia based on previous weight loss surgery.  Had some lysis of adhesions at that time as well as fixing of the internal hernia.  The surgery was uncomplicated, and patient is ready to feel better and get this hysterectomy done with.  Had a CAT scan done on 6/22 showing a 3.2 cm left ovarian cyst.  No free fluid.  Patient also has a history of interstitial cystitis and is managed by her primary care doctor for that and pelvic pain.  Currently does take oxycodone on a regular basis for pelvic pain as well.  O:  Physical exam:  Heart is regular rate and rhythm.  Lungs are clear to auscultation bilaterally.  Abdomen is nondistended and nontender.  The incisions are well healed.   Uterus is anteverted.  Left adnexal tenderness is 2 out of 5.  No rebound or guarding.  Some fullness in the adnexa consistent with ovarian cysts.  PAST MEDICAL HISTORY:  Migraines, hiatal hernia, hypertension, chronic pelvic pain, interstitial cystitis, recurrent left ovarian cyst and status post gastric bypass surgery.   MEDICATIONS:  Amitriptyline, oxycodone,  lisinopril, Ambien, Zofran, Bactrim ALLERGIES:  She has an allergy to codeine.  She can take oxycodone and Vicodin.  Needs to limit her anti-inflammatory medications due to her history of gastric bypass surgery, but she can tolerate Toradol.   A:   Pelvic pain, left ovarian cyst, endometriosis, dyspareunia, recent endometrial biopsy which was normal.  She is status post ablation.  She desires hysterectomy with removal of the left tube and ovary.   P:  Laparoscopic-assisted vaginal hysterectomy with left salpingo-oophorectomy, lysis of adhesions.  She again does understand that this will make her menopausal, and she is ready for that.  She is hoping the pain will improve.  Discussed the procedure at length, its risks, its benefits which include but are not limited to risk of infection, bleeding, damage to bowel, bladder, ureters, ovaries being completely removed, risk associated with menopause, the possibility that this may or may not alleviate the pelvic pain.  Risks associated with surgery including wound infection, dehiscence, potential worsening of bowel issues or scar tissue or adhesions, the possibility of having to proceed with a laparotomy.  She does give informed consent and wished to proceed.  I also wrote a prescription for Xanax 0.5 mg to take as needed because she has to self cath right now for her interstitial cystitis.   Louretta Shorten, MD/[id number]  This patient has been seen and examined.   All of her questions were answered.  Labs and vital signs reviewed.  Informed consent has been obtained.  The History and Physical is current.This patient has  been seen and examined.   All of her questions were answered.  Labs and vital signs reviewed.  Informed consent has been obtained.  The History and Physical is current. 05/06/14 0715 DL

## 2014-05-06 ENCOUNTER — Encounter (HOSPITAL_COMMUNITY): Payer: Medicare HMO | Admitting: Anesthesiology

## 2014-05-06 ENCOUNTER — Encounter (HOSPITAL_COMMUNITY)
Admission: RE | Disposition: A | Discharge status: Home / Self Care | Payer: Self-pay | Source: Ambulatory Visit | Attending: Obstetrics and Gynecology

## 2014-05-06 ENCOUNTER — Encounter (HOSPITAL_COMMUNITY): Payer: Self-pay | Admitting: Anesthesiology

## 2014-05-06 ENCOUNTER — Inpatient Hospital Stay (HOSPITAL_COMMUNITY): Payer: Medicare HMO | Admitting: Anesthesiology

## 2014-05-06 ENCOUNTER — Observation Stay (HOSPITAL_COMMUNITY)
Admission: RE | Admit: 2014-05-06 | Discharge: 2014-05-07 | Disposition: A | Discharge status: Home / Self Care | Payer: Medicare HMO | Source: Ambulatory Visit | Attending: Obstetrics and Gynecology | Admitting: Obstetrics and Gynecology

## 2014-05-06 DIAGNOSIS — IMO0002 Reserved for concepts with insufficient information to code with codable children: Secondary | ICD-10-CM | POA: Diagnosis not present

## 2014-05-06 DIAGNOSIS — N831 Corpus luteum cyst of ovary, unspecified side: Secondary | ICD-10-CM | POA: Insufficient documentation

## 2014-05-06 DIAGNOSIS — N83209 Unspecified ovarian cyst, unspecified side: Secondary | ICD-10-CM | POA: Insufficient documentation

## 2014-05-06 DIAGNOSIS — N803 Endometriosis of pelvic peritoneum, unspecified: Secondary | ICD-10-CM | POA: Insufficient documentation

## 2014-05-06 DIAGNOSIS — N949 Unspecified condition associated with female genital organs and menstrual cycle: Secondary | ICD-10-CM | POA: Insufficient documentation

## 2014-05-06 DIAGNOSIS — D649 Anemia, unspecified: Secondary | ICD-10-CM | POA: Insufficient documentation

## 2014-05-06 DIAGNOSIS — G8929 Other chronic pain: Secondary | ICD-10-CM | POA: Diagnosis not present

## 2014-05-06 DIAGNOSIS — N938 Other specified abnormal uterine and vaginal bleeding: Secondary | ICD-10-CM | POA: Insufficient documentation

## 2014-05-06 DIAGNOSIS — N3010 Interstitial cystitis (chronic) without hematuria: Secondary | ICD-10-CM | POA: Insufficient documentation

## 2014-05-06 DIAGNOSIS — Z9884 Bariatric surgery status: Secondary | ICD-10-CM | POA: Insufficient documentation

## 2014-05-06 DIAGNOSIS — Z90710 Acquired absence of both cervix and uterus: Secondary | ICD-10-CM

## 2014-05-06 DIAGNOSIS — D279 Benign neoplasm of unspecified ovary: Secondary | ICD-10-CM | POA: Insufficient documentation

## 2014-05-06 DIAGNOSIS — Z885 Allergy status to narcotic agent status: Secondary | ICD-10-CM | POA: Insufficient documentation

## 2014-05-06 DIAGNOSIS — I10 Essential (primary) hypertension: Secondary | ICD-10-CM | POA: Insufficient documentation

## 2014-05-06 HISTORY — PX: LAPAROSCOPIC ASSISTED VAGINAL HYSTERECTOMY: SHX5398

## 2014-05-06 HISTORY — PX: SALPINGOOPHORECTOMY: SHX82

## 2014-05-06 HISTORY — PX: LAPAROSCOPIC LYSIS OF ADHESIONS: SHX5905

## 2014-05-06 SURGERY — HYSTERECTOMY, VAGINAL, LAPAROSCOPY-ASSISTED
Anesthesia: General | Site: Abdomen

## 2014-05-06 MED ORDER — FENTANYL CITRATE 0.05 MG/ML IJ SOLN
INTRAMUSCULAR | Status: AC
  Filled 2014-05-06: qty 2

## 2014-05-06 MED ORDER — OXYCODONE-ACETAMINOPHEN 5-325 MG PO TABS
1.0000 | ORAL_TABLET | ORAL | Status: DC | PRN
  Administered 2014-05-06 – 2014-05-07 (×3): 2 via ORAL
  Filled 2014-05-06 (×3): qty 2

## 2014-05-06 MED ORDER — PROMETHAZINE HCL 25 MG/ML IJ SOLN: 6.2500 mg | INTRAMUSCULAR | Status: DC | PRN

## 2014-05-06 MED ORDER — ZOLPIDEM TARTRATE 5 MG PO TABS
5.0000 mg | ORAL_TABLET | Freq: Every evening | ORAL | Status: DC | PRN
  Administered 2014-05-06: 5 mg via ORAL
  Filled 2014-05-06: qty 1

## 2014-05-06 MED ORDER — AMITRIPTYLINE HCL 75 MG PO TABS
75.0000 mg | ORAL_TABLET | Freq: Two times a day (BID) | ORAL | Status: DC
  Administered 2014-05-06 – 2014-05-07 (×2): 75 mg via ORAL
  Filled 2014-05-06 (×3): qty 1

## 2014-05-06 MED ORDER — FENTANYL CITRATE 0.05 MG/ML IJ SOLN
INTRAMUSCULAR | Status: DC | PRN
  Administered 2014-05-06 (×2): 50 ug via INTRAVENOUS
  Administered 2014-05-06: 100 ug via INTRAVENOUS
  Administered 2014-05-06 (×3): 50 ug via INTRAVENOUS

## 2014-05-06 MED ORDER — ACETAMINOPHEN 10 MG/ML IV SOLN
1000.0000 mg | Freq: Once | INTRAVENOUS | Status: AC
  Administered 2014-05-06: 1000 mg via INTRAVENOUS
  Filled 2014-05-06: qty 100

## 2014-05-06 MED ORDER — DEXTROSE-NACL 5-0.45 % IV SOLN
INTRAVENOUS | Status: DC
  Administered 2014-05-06 (×2): via INTRAVENOUS

## 2014-05-06 MED ORDER — GLYCOPYRROLATE 0.2 MG/ML IJ SOLN
INTRAMUSCULAR | Status: DC | PRN
  Administered 2014-05-06: .8 mg via INTRAVENOUS

## 2014-05-06 MED ORDER — ROCURONIUM BROMIDE 100 MG/10ML IV SOLN
INTRAVENOUS | Status: DC | PRN
  Administered 2014-05-06: 40 mg via INTRAVENOUS
  Administered 2014-05-06: 10 mg via INTRAVENOUS

## 2014-05-06 MED ORDER — SCOPOLAMINE 1 MG/3DAYS TD PT72
1.0000 | MEDICATED_PATCH | Freq: Once | TRANSDERMAL | Status: DC
  Administered 2014-05-06: 1.5 mg via TRANSDERMAL

## 2014-05-06 MED ORDER — CYCLOBENZAPRINE HCL 10 MG PO TABS
10.0000 mg | ORAL_TABLET | Freq: Three times a day (TID) | ORAL | Status: DC | PRN
  Filled 2014-05-06: qty 1

## 2014-05-06 MED ORDER — LISINOPRIL 20 MG PO TABS
20.0000 mg | ORAL_TABLET | Freq: Every day | ORAL | Status: DC
Start: 2014-05-07 — End: 2014-05-07
  Administered 2014-05-07: 20 mg via ORAL
  Filled 2014-05-06: qty 1

## 2014-05-06 MED ORDER — HYDROMORPHONE HCL PF 1 MG/ML IJ SOLN
INTRAMUSCULAR | Status: AC
  Administered 2014-05-06: 0.5 mg via INTRAVENOUS
  Filled 2014-05-06: qty 1

## 2014-05-06 MED ORDER — MIDAZOLAM HCL 2 MG/2ML IJ SOLN
INTRAMUSCULAR | Status: AC
  Administered 2014-05-06: 2 mg via INTRAVENOUS
  Filled 2014-05-06: qty 2

## 2014-05-06 MED ORDER — DEXAMETHASONE SODIUM PHOSPHATE 10 MG/ML IJ SOLN
INTRAMUSCULAR | Status: AC
  Filled 2014-05-06: qty 1

## 2014-05-06 MED ORDER — 0.9 % SODIUM CHLORIDE (POUR BTL) OPTIME
TOPICAL | Status: DC | PRN
  Administered 2014-05-06: 1000 mL

## 2014-05-06 MED ORDER — MENTHOL 3 MG MT LOZG: 1.0000 | LOZENGE | OROMUCOSAL | Status: DC | PRN

## 2014-05-06 MED ORDER — DIPHENHYDRAMINE HCL 50 MG/ML IJ SOLN: 12.5000 mg | Freq: Four times a day (QID) | INTRAMUSCULAR | Status: DC | PRN

## 2014-05-06 MED ORDER — ONDANSETRON HCL 4 MG/2ML IJ SOLN
INTRAMUSCULAR | Status: DC | PRN
  Administered 2014-05-06: 4 mg via INTRAVENOUS

## 2014-05-06 MED ORDER — HYDROMORPHONE HCL PF 1 MG/ML IJ SOLN
0.2500 mg | INTRAMUSCULAR | Status: DC | PRN
Start: 2014-05-06 — End: 2014-05-06
  Administered 2014-05-06 (×3): 0.5 mg via INTRAVENOUS

## 2014-05-06 MED ORDER — HYDROMORPHONE HCL PF 1 MG/ML IJ SOLN
0.5000 mg | INTRAMUSCULAR | Status: AC | PRN
Start: 2014-05-06 — End: 2014-05-06
  Administered 2014-05-06 (×2): 0.5 mg via INTRAVENOUS

## 2014-05-06 MED ORDER — FENTANYL CITRATE 0.05 MG/ML IJ SOLN
INTRAMUSCULAR | Status: AC
  Filled 2014-05-06: qty 5

## 2014-05-06 MED ORDER — ALPRAZOLAM 0.5 MG PO TABS: 0.5000 mg | ORAL_TABLET | Freq: Every evening | ORAL | Status: DC | PRN

## 2014-05-06 MED ORDER — ONDANSETRON HCL 4 MG PO TABS: 4.0000 mg | ORAL_TABLET | Freq: Three times a day (TID) | ORAL | Status: DC | PRN

## 2014-05-06 MED ORDER — BUPIVACAINE HCL (PF) 0.25 % IJ SOLN
INTRAMUSCULAR | Status: DC | PRN
  Administered 2014-05-06: 10 mL

## 2014-05-06 MED ORDER — NEOSTIGMINE METHYLSULFATE 10 MG/10ML IV SOLN
INTRAVENOUS | Status: DC | PRN
  Administered 2014-05-06: 4 mg via INTRAVENOUS

## 2014-05-06 MED ORDER — LIDOCAINE HCL (CARDIAC) 20 MG/ML IV SOLN
INTRAVENOUS | Status: AC
  Filled 2014-05-06: qty 5

## 2014-05-06 MED ORDER — HYDROMORPHONE 0.3 MG/ML IV SOLN
INTRAVENOUS | Status: DC
  Administered 2014-05-06: 2.4 mg via INTRAVENOUS
  Administered 2014-05-06: 18 mL via INTRAVENOUS
  Administered 2014-05-06: 11:00:00 via INTRAVENOUS
  Administered 2014-05-06: 8.26 mg via INTRAVENOUS
  Administered 2014-05-06: 17:00:00 via INTRAVENOUS
  Filled 2014-05-06 (×2): qty 25

## 2014-05-06 MED ORDER — ENSURE COMPLETE PO LIQD
237.0000 mL | Freq: Three times a day (TID) | ORAL | Status: DC
  Administered 2014-05-06 – 2014-05-07 (×2): 237 mL via ORAL
  Filled 2014-05-06 (×4): qty 237

## 2014-05-06 MED ORDER — NALOXONE HCL 0.4 MG/ML IJ SOLN: 0.4000 mg | INTRAMUSCULAR | Status: DC | PRN

## 2014-05-06 MED ORDER — ONDANSETRON HCL 4 MG/2ML IJ SOLN: 4.0000 mg | Freq: Four times a day (QID) | INTRAMUSCULAR | Status: DC | PRN

## 2014-05-06 MED ORDER — PROPOFOL 10 MG/ML IV EMUL
INTRAVENOUS | Status: AC
  Filled 2014-05-06: qty 50

## 2014-05-06 MED ORDER — MIDAZOLAM HCL 2 MG/2ML IJ SOLN
0.5000 mg | Freq: Once | INTRAMUSCULAR | Status: AC | PRN
  Administered 2014-05-06: 2 mg via INTRAVENOUS

## 2014-05-06 MED ORDER — LACTATED RINGERS IV SOLN
INTRAVENOUS | Status: DC
  Administered 2014-05-06 (×3): via INTRAVENOUS
  Administered 2014-05-06: 1000 mL via INTRAVENOUS

## 2014-05-06 MED ORDER — MIDAZOLAM HCL 2 MG/2ML IJ SOLN
INTRAMUSCULAR | Status: AC
  Filled 2014-05-06: qty 2

## 2014-05-06 MED ORDER — MEPERIDINE HCL 25 MG/ML IJ SOLN: 6.2500 mg | INTRAMUSCULAR | Status: DC | PRN

## 2014-05-06 MED ORDER — BUPIVACAINE HCL (PF) 0.25 % IJ SOLN
INTRAMUSCULAR | Status: AC
  Filled 2014-05-06: qty 30

## 2014-05-06 MED ORDER — SCOPOLAMINE 1 MG/3DAYS TD PT72
MEDICATED_PATCH | TRANSDERMAL | Status: AC
  Administered 2014-05-06: 1.5 mg via TRANSDERMAL
  Filled 2014-05-06: qty 1

## 2014-05-06 MED ORDER — SODIUM CHLORIDE 0.9 % IJ SOLN: 9.0000 mL | INTRAMUSCULAR | Status: DC | PRN

## 2014-05-06 MED ORDER — HYDROCHLOROTHIAZIDE 25 MG PO TABS
25.0000 mg | ORAL_TABLET | Freq: Every day | ORAL | Status: DC
  Administered 2014-05-07: 25 mg via ORAL
  Filled 2014-05-06: qty 1

## 2014-05-06 MED ORDER — DEXTROSE 5 % IV SOLN
2.0000 g | INTRAVENOUS | Status: AC
  Administered 2014-05-06: 2 g via INTRAVENOUS
  Filled 2014-05-06: qty 2

## 2014-05-06 MED ORDER — ONDANSETRON HCL 4 MG/2ML IJ SOLN
INTRAMUSCULAR | Status: AC
  Filled 2014-05-06: qty 2

## 2014-05-06 MED ORDER — MIDAZOLAM HCL 5 MG/5ML IJ SOLN
INTRAMUSCULAR | Status: DC | PRN
  Administered 2014-05-06: 2 mg via INTRAVENOUS

## 2014-05-06 MED ORDER — DEXAMETHASONE SODIUM PHOSPHATE 4 MG/ML IJ SOLN
INTRAMUSCULAR | Status: DC | PRN
  Administered 2014-05-06: 10 mg via INTRAVENOUS

## 2014-05-06 MED ORDER — ENSURE PUDDING PO PUDG: 1.0000 | Freq: Three times a day (TID) | ORAL | Status: DC

## 2014-05-06 MED ORDER — ROCURONIUM BROMIDE 100 MG/10ML IV SOLN
INTRAVENOUS | Status: AC
  Filled 2014-05-06: qty 1

## 2014-05-06 MED ORDER — CLONAZEPAM 0.5 MG PO TABS
1.0000 mg | ORAL_TABLET | Freq: Three times a day (TID) | ORAL | Status: DC
  Administered 2014-05-06 – 2014-05-07 (×4): 1 mg via ORAL
  Filled 2014-05-06 (×4): qty 2

## 2014-05-06 MED ORDER — LIDOCAINE HCL (CARDIAC) 20 MG/ML IV SOLN
INTRAVENOUS | Status: DC | PRN
  Administered 2014-05-06: 40 mg via INTRAVENOUS
  Administered 2014-05-06: 60 mg via INTRAVENOUS

## 2014-05-06 MED ORDER — LISINOPRIL-HYDROCHLOROTHIAZIDE 20-25 MG PO TABS: 1.0000 | ORAL_TABLET | Freq: Every morning | ORAL | Status: DC

## 2014-05-06 MED ORDER — HYDROMORPHONE HCL PF 1 MG/ML IJ SOLN
0.2000 mg | INTRAMUSCULAR | Status: DC | PRN
  Administered 2014-05-07: 0.2 mg via INTRAVENOUS
  Filled 2014-05-06: qty 1

## 2014-05-06 MED ORDER — LACTATED RINGERS IR SOLN
Status: DC | PRN
  Administered 2014-05-06: 3000 mL

## 2014-05-06 MED ORDER — PROPOFOL INFUSION 10 MG/ML OPTIME
INTRAVENOUS | Status: DC | PRN
  Administered 2014-05-06: 200 mL via INTRAVENOUS

## 2014-05-06 MED ORDER — DIPHENHYDRAMINE HCL 12.5 MG/5ML PO ELIX: 12.5000 mg | ORAL_SOLUTION | Freq: Four times a day (QID) | ORAL | Status: DC | PRN

## 2014-05-06 SURGICAL SUPPLY — 49 items
BLADE 15 SAFETY STRL DISP (BLADE) ×4 IMPLANT
BLADE SURG 10 STRL SS (BLADE) ×4 IMPLANT
BLADE SURG 11 STRL SS (BLADE) ×8 IMPLANT
CABLE HIGH FREQUENCY MONO STRZ (ELECTRODE) IMPLANT
CATH ROBINSON RED A/P 16FR (CATHETERS) ×4 IMPLANT
CLOSURE WOUND 1/4 X3 (GAUZE/BANDAGES/DRESSINGS)
CLOTH BEACON ORANGE TIMEOUT ST (SAFETY) ×4 IMPLANT
CONT PATH 16OZ SNAP LID 3702 (MISCELLANEOUS) ×4 IMPLANT
COVER TABLE BACK 60X90 (DRAPES) ×4 IMPLANT
DECANTER SPIKE VIAL GLASS SM (MISCELLANEOUS) ×4 IMPLANT
DERMABOND ADVANCED (GAUZE/BANDAGES/DRESSINGS) ×2
DERMABOND ADVANCED .7 DNX12 (GAUZE/BANDAGES/DRESSINGS) ×2 IMPLANT
DRSG COVADERM PLUS 2X2 (GAUZE/BANDAGES/DRESSINGS) ×8 IMPLANT
DRSG OPSITE POSTOP 3X4 (GAUZE/BANDAGES/DRESSINGS) ×4 IMPLANT
DURAPREP 26ML APPLICATOR (WOUND CARE) ×4 IMPLANT
ELECT LIGASURE LONG (ELECTRODE) ×4 IMPLANT
ELECT LIGASURE SHORT 9 REUSE (ELECTRODE) ×4 IMPLANT
ELECT REM PT RETURN 9FT ADLT (ELECTROSURGICAL) ×4
ELECTRODE REM PT RTRN 9FT ADLT (ELECTROSURGICAL) ×2 IMPLANT
FORCEPS CUTTING 45CM 5MM (CUTTING FORCEPS) ×4 IMPLANT
GLOVE BIO SURGEON STRL SZ8 (GLOVE) ×4 IMPLANT
GLOVE BIOGEL PI IND STRL 6.5 (GLOVE) ×2 IMPLANT
GLOVE BIOGEL PI IND STRL 7.0 (GLOVE) ×6 IMPLANT
GLOVE BIOGEL PI INDICATOR 6.5 (GLOVE) ×2
GLOVE BIOGEL PI INDICATOR 7.0 (GLOVE) ×6
GLOVE SURG ORTHO 8.0 STRL STRW (GLOVE) ×12 IMPLANT
GOWN STRL REUS W/ TWL LRG LVL3 (GOWN DISPOSABLE) ×14 IMPLANT
GOWN STRL REUS W/TWL LRG LVL3 (GOWN DISPOSABLE) ×14
NEEDLE INSUFFLATION 120MM (ENDOMECHANICALS) ×4 IMPLANT
NS IRRIG 1000ML POUR BTL (IV SOLUTION) ×4 IMPLANT
PACK LAVH (CUSTOM PROCEDURE TRAY) ×4 IMPLANT
PROTECTOR NERVE ULNAR (MISCELLANEOUS) ×4 IMPLANT
SET IRRIG TUBING LAPAROSCOPIC (IRRIGATION / IRRIGATOR) ×4 IMPLANT
SOLUTION ELECTROLUBE (MISCELLANEOUS) IMPLANT
STRIP CLOSURE SKIN 1/4X3 (GAUZE/BANDAGES/DRESSINGS) IMPLANT
SUT MNCRL 0 MO-4 VIOLET 18 CR (SUTURE) ×4 IMPLANT
SUT MNCRL 0 VIOLET 6X18 (SUTURE) ×2 IMPLANT
SUT MNCRL AB 0 CT1 27 (SUTURE) IMPLANT
SUT MON AB 2-0 CT1 36 (SUTURE) IMPLANT
SUT MONOCRYL 0 6X18 (SUTURE) ×2
SUT MONOCRYL 0 MO 4 18  CR/8 (SUTURE) ×4
SUT VICRYL 0 UR6 27IN ABS (SUTURE) ×4 IMPLANT
SUT VICRYL RAPIDE 3 0 (SUTURE) ×4 IMPLANT
TOWEL OR 17X24 6PK STRL BLUE (TOWEL DISPOSABLE) ×8 IMPLANT
TRAY FOLEY CATH 14FR (SET/KITS/TRAYS/PACK) ×4 IMPLANT
TROCAR OPTI TIP 5M 100M (ENDOMECHANICALS) ×4 IMPLANT
TROCAR XCEL DIL TIP R 11M (ENDOMECHANICALS) ×4 IMPLANT
WARMER LAPAROSCOPE (MISCELLANEOUS) ×4 IMPLANT
WATER STERILE IRR 1000ML POUR (IV SOLUTION) IMPLANT

## 2014-05-06 NOTE — Anesthesia Preprocedure Evaluation (Addendum)
Anesthesia Evaluation  Patient identified by MRN, date of birth, ID band Patient awake    Reviewed: Allergy & Precautions, H&P , Patient's Chart, lab work & pertinent test results, reviewed documented beta blocker date and time   History of Anesthesia Complications Negative for: history of anesthetic complications  Airway Mallampati: II TM Distance: >3 FB Neck ROM: full    Dental   Pulmonary  breath sounds clear to auscultation        Cardiovascular Exercise Tolerance: Good hypertension, Rhythm:regular Rate:Normal     Neuro/Psych  Headaches,  Neuromuscular disease    GI/Hepatic hiatal hernia,   Endo/Other    Renal/GU      Musculoskeletal   Abdominal   Peds  Hematology  (+) anemia ,   Anesthesia Other Findings Chronic nausea- none now 2/2 to npo  Reproductive/Obstetrics                          Anesthesia Physical Anesthesia Plan  ASA: III  Anesthesia Plan: General ETT   Post-op Pain Management:    Induction:   Airway Management Planned:   Additional Equipment:   Intra-op Plan:   Post-operative Plan:   Informed Consent: I have reviewed the patients History and Physical, chart, labs and discussed the procedure including the risks, benefits and alternatives for the proposed anesthesia with the patient or authorized representative who has indicated his/her understanding and acceptance.   Dental Advisory Given  Plan Discussed with: CRNA and Surgeon  Anesthesia Plan Comments:        Anesthesia Quick Evaluation

## 2014-05-06 NOTE — Brief Op Note (Signed)
05/06/2014  8:52 AM  PATIENT:  Jordan Velez  43 y.o. female  PRE-OPERATIVE DIAGNOSIS:  pelvic pain, endometriosis, pelvic adhesions, recurrent left ovarian cysts, Abnormal Uterine Bleeding  POST-OPERATIVE DIAGNOSIS:  pelvic pain, endometriosis, pelvic adhesions, recurrent left ovarian cysts, Abnormal Uterine Bleedin  PROCEDURE:  Procedure(s): LAPAROSCOPIC ASSISTED VAGINAL HYSTERECTOMY  (N/A) SALPINGO OOPHORECTOMY (Left) LAPAROSCOPIC LYSIS OF ADHESIONS (N/A)  SURGEON:  Surgeon(s) and Role:    * Luz Lex, MD - Primary    * Margarette Asal, MD - Assisting  PHYSICIAN ASSISTANT:   ASSISTANTS: [place]   ANESTHESIA:   local and general  EBL:  Total I/O In: 1000 [I.V.:1000] Out: 100 [Blood:100]  BLOOD ADMINISTERED:none  DRAINS: Urinary Catheter (Foley)   LOCAL MEDICATIONS USED:  MARCAINE     SPECIMEN:  Source of Specimen:  Uterus and left tube and ovary  DISPOSITION OF SPECIMEN:  PATHOLOGY  COUNTS:  YES  TOURNIQUET:  * No tourniquets in log *  DICTATION: .Other Dictation: Dictation Number 1  PLAN OF CARE: Admit for overnight observation  PATIENT DISPOSITION:  PACU - hemodynamically stable.   Delay start of Pharmacological VTE agent (>24hrs) due to surgical blood loss or risk of bleeding: not applicable

## 2014-05-06 NOTE — Anesthesia Postprocedure Evaluation (Signed)
Anesthesia Post Note  Patient: Jordan Velez  Procedure(s) Performed: Procedure(s) (LRB): LAPAROSCOPIC ASSISTED VAGINAL HYSTERECTOMY  (N/A) SALPINGO OOPHORECTOMY (Left) LAPAROSCOPIC LYSIS OF ADHESIONS (N/A)  Anesthesia type: General  Patient location: PACU  Post pain: Pain level controlled  Post assessment: Post-op Vital signs reviewed  Last Vitals:  Filed Vitals:   05/06/14 0945  BP: 129/76  Pulse: 79  Temp:   Resp: 15    Post vital signs: Reviewed  Level of consciousness: sedated  Complications: No apparent anesthesia complications

## 2014-05-06 NOTE — Transfer of Care (Signed)
Immediate Anesthesia Transfer of Care Note  Patient: Jordan Velez  Procedure(s) Performed: Procedure(s): LAPAROSCOPIC ASSISTED VAGINAL HYSTERECTOMY  (N/A) SALPINGO OOPHORECTOMY (Left) LAPAROSCOPIC LYSIS OF ADHESIONS (N/A)  Patient Location: PACU  Anesthesia Type:General  Level of Consciousness: awake, alert  and oriented  Airway & Oxygen Therapy: Patient Spontanous Breathing and Patient connected to nasal cannula oxygen  Post-op Assessment: Report given to PACU RN and Post -op Vital signs reviewed and stable  Post vital signs: Reviewed and stable  Complications: No apparent anesthesia complications

## 2014-05-06 NOTE — Addendum Note (Signed)
Addendum created 05/06/14 1524 by Ignacia Bayley, CRNA   Modules edited: Notes Section   Notes Section:  File: [id number]

## 2014-05-06 NOTE — Anesthesia Postprocedure Evaluation (Signed)
  Anesthesia Post-op Note  Patient: Jordan Velez  Procedure(s) Performed: Procedure(s): LAPAROSCOPIC ASSISTED VAGINAL HYSTERECTOMY  (N/A) SALPINGO OOPHORECTOMY (Left) LAPAROSCOPIC LYSIS OF ADHESIONS (N/A)  Patient Location: [facility]  Anesthesia Type:General  Level of Consciousness: awake  Airway and Oxygen Therapy: Patient Spontanous Breathing  Post-op Pain: moderate  Post-op Assessment: Patient's Cardiovascular Status Stable and Respiratory Function Stable  Post-op Vital Signs: stable  Last Vitals:  Filed Vitals:   05/06/14 1340  BP: 112/67  Pulse: 108  Temp: 36.7 C  Resp: 14    Complications: No apparent anesthesia complications

## 2014-05-07 ENCOUNTER — Encounter (HOSPITAL_COMMUNITY): Payer: Self-pay | Admitting: Obstetrics and Gynecology

## 2014-05-07 DIAGNOSIS — G8929 Other chronic pain: Secondary | ICD-10-CM | POA: Diagnosis not present

## 2014-05-07 LAB — CBC
HEMATOCRIT: 28.6 % — AB (ref 36.0–46.0)
Hemoglobin: 9.2 g/dL — ABNORMAL LOW (ref 12.0–15.0)
MCH: 27.1 pg (ref 26.0–34.0)
MCHC: 32.2 g/dL (ref 30.0–36.0)
MCV: 84.1 fL (ref 78.0–100.0)
Platelets: 334 10*3/uL (ref 150–400)
RBC: 3.4 MIL/uL — ABNORMAL LOW (ref 3.87–5.11)
RDW: 15.5 % (ref 11.5–15.5)
WBC: 12 10*3/uL — ABNORMAL HIGH (ref 4.0–10.5)

## 2014-05-07 MED ORDER — OXYCODONE-ACETAMINOPHEN 5-325 MG PO TABS: 1.0000 | ORAL_TABLET | ORAL | Status: DC | PRN

## 2014-05-07 MED ORDER — ALPRAZOLAM 0.5 MG PO TABS: 0.5000 mg | ORAL_TABLET | Freq: Every evening | ORAL | Status: DC | PRN

## 2014-05-07 NOTE — Discharge Summary (Signed)
Physician Discharge Summary  Patient ID: Jordan Velez MRN: [id number] DOB/AGE: 10/25/1970 43 y.o.  Admit date: 05/06/2014 Discharge date: 05/07/2014  Admission Diagnoses:  Discharge Diagnoses:  Active Problems:   S/P laparoscopic assisted vaginal hysterectomy (LAVH)   Discharged Condition: good  Hospital Course: Pt underwent uncomplicated LAVH/LSO.  100cc EBL.  Her postop course was unremarkable with quick return of bowel and bladder function (although limited by previous comorbidities).  Post op D 1 her Hgb was 9.3, she tolerated breakfast well, ambulated without difficulty, voided on her own and pain was well controlled.  Consults: None  Significant Diagnostic Studies: labs: hgb 9.3  Treatments: surgery: LAVH,LSO  Discharge Exam: Blood pressure 118/74, pulse 96, temperature 98.2 F (36.8 C), temperature source Oral, resp. rate 18, height 5' 6.5" (1.689 m), weight 98.884 kg (218 lb), SpO2 100.00%. General appearance: alert, cooperative, appears stated age and no distress GI: soft, non-tender; bowel sounds normal; no masses,  no organomegaly Incision/Wound:CD&I  Disposition: 01-Home or Self Care  Discharge Instructions   Call MD for:  difficulty breathing, headache or visual disturbances    Complete by:  As directed      Call MD for:  persistant nausea and vomiting    Complete by:  As directed      Call MD for:  redness, tenderness, or signs of infection (pain, swelling, redness, odor or green/yellow discharge around incision site)    Complete by:  As directed      Call MD for:  severe uncontrolled pain    Complete by:  As directed      Call MD for:  temperature >100.4    Complete by:  As directed      Diet general    Complete by:  As directed      Driving Restrictions    Complete by:  As directed   No driving for 2 weeks     Increase activity slowly    Complete by:  As directed      Lifting restrictions    Complete by:  As directed   No lifting anything greater  than 10 pounds (if you have to ask, don't lift it)     Sexual Activity Restrictions    Complete by:  As directed   Nothing in the vagina for 6 weeks            Medication List         ALPRAZolam 0.5 MG tablet  Commonly known as:  XANAX  Take 1 tablet (0.5 mg total) by mouth at bedtime as needed for anxiety (every eight hrs).     AMBIEN 10 MG tablet  Generic drug:  zolpidem  Take 10 mg by mouth at bedtime as needed for sleep.     amitriptyline 150 MG tablet  Commonly known as:  ELAVIL  Take 75 mg by mouth 2 (two) times daily.     calcium carbonate 500 MG chewable tablet  Commonly known as:  TUMS - dosed in mg elemental calcium  Chew 2 tablets by mouth 2 (two) times daily.     clonazePAM 1 MG tablet  Commonly known as:  KLONOPIN  Take 1 mg by mouth 3 (three) times daily.     cyclobenzaprine 10 MG tablet  Commonly known as:  FLEXERIL  Take 10 mg by mouth 3 (three) times daily as needed for muscle spasms.     lisinopril-hydrochlorothiazide 20-25 MG per tablet  Commonly known as:  PRINZIDE,ZESTORETIC  Take 1 tablet by mouth  every morning.     ondansetron 4 MG tablet  Commonly known as:  ZOFRAN  Take 1 tablet (4 mg total) by mouth every 8 (eight) hours as needed for nausea or vomiting.     oxyCODONE 5 MG immediate release tablet  Commonly known as:  ROXICODONE  Take 1 tablet (5 mg total) by mouth every 4 (four) hours as needed for severe pain.     oxyCODONE-acetaminophen 5-325 MG per tablet  Commonly known as:  PERCOCET/ROXICET  Take 1-2 tablets by mouth every 4 (four) hours as needed for severe pain (moderate to severe pain (when tolerating fluids)).     PRESCRIPTION MEDICATION  23 mLs by Intracatheter route daily as needed (for bladder pain). Sodium Bicarb, Heparin, Lidocaine: combination inserted thru catheter into bladder.     sulfamethoxazole-trimethoprim 800-160 MG per tablet  Commonly known as:  BACTRIM DS  Take 1 tablet by mouth 2 (two) times daily as needed  (For Catheter). Prevent infection         Signed: Jarone Ostergaard C 05/07/2014, 8:09 AM

## 2014-05-07 NOTE — Progress Notes (Signed)
Pt ambulated out teaching complete  

## 2014-05-10 NOTE — Op Note (Signed)
NAME:  Jordan Velez, Jordan Velez                ACCOUNT NO.:  [id number]  MEDICAL RECORD NO.:  [id number]  LOCATION:                                 FACILITY:  PHYSICIAN:  Monia Sabal. Corinna Capra, M.D.    DATE OF BIRTH:  Apr 27, 1971  DATE OF PROCEDURE:  05/06/2014 DATE OF DISCHARGE:  05/07/2014                              OPERATIVE REPORT   PREOPERATIVE DIAGNOSES:  Pelvic pain, dyspareunia, recurrent left ovarian cyst, history of endometriosis, and pelvic adhesions.  POSTOPERATIVE DIAGNOSES:  Pelvic pain, dyspareunia, recurrent left ovarian cyst, history of endometriosis, and pelvic adhesions.  PROCEDURE:  Laparoscopic-assisted vaginal hysterectomy with left salpingo-oophorectomy and lysis of adhesions.  SURGEON:  Monia Sabal. Corinna Capra, MD  ASSISTANT:  Ralene Bathe. Matthew Saras, M.D.  ANESTHESIA:  General endotracheal.  INDICATIONS:  Ms. Fritsch has been having worsening problems with pelvic pain, history of endometriosis.  She has previously had right tube and ovary removed.  She has history of pelvic adhesions, specifically on the left side, which she has gone through lysis of adhesions 6 months ago. She has had recurrent left ovarian cyst.  At this time, she desired definitive surgical intervention.  Plan is removal of left tube and ovary.  __________ she also requests hysterectomy.  She has had a NovoSure endometrial ablation in the past and she has pelvic pain with intercourse as well.  Risks and benefits of the above procedures were discussed at length including but not limited to risk of infection, bleeding, damage to bowel and bladder, __________.  She gives the informed consent and wished to proceed.  FINDINGS AT THE TIME OF SURGERY:  The left ovary and fallopian tube were __________ left sidewall.  No cyst was noted today; however, uterus was slightly boggy in appearance.  The right tube and ovary were surgically absent.  DESCRIPTION OF PROCEDURE:  After adequate analgesia, the patient was placed in  the dorsal lithotomy position.  She was sterilely prepped and draped.  Bladder sterilely drained.  Graves speculum was placed.  A Hulka tenaculum was placed into the cervix.  A 1 cm infraumbilical skin incision was made.  A Veress needle was inserted.  The abdomen was insufflated with dullness to percussion.  An 11 mm trocar was inserted. The laparoscope was inserted.  The above findings were then noted.  A 5 mm trocar was inserted to the left of the midline 2 fingerbreadths above the pubic symphysis.  After careful and systematic evaluation of the abdomen and pelvis, grasping forceps were used to provide countertraction of the left tube and ovary against pelvic sidewall and careful dissection was carried out using Gyrus cutting forceps along the infundibulopelvic ligament __________ left tube and ovary and pelvic sidewall, care taken to avoid the __________ ureter and bowel.  Left tube and ovary were removed from the pelvic sidewall.  Infundibulopelvic ligament __________ down to the round ligament.  Bladder was then elevated and a small window was made at the ureterovesical junction creating a small bladder flap.  The abdomen was then desufflated, the legs were repositioned.  Weighted speculum placed in the vagina. Posterior colpotomy was then performed.  Cervix was then circumscribed with Bovie cautery.  LigaSure instrument  was used to ligate across the uterosacral ligaments bilaterally, cardinal ligaments __________ bilaterally.  The bladder was then dissected off the anterior surface of the cervix and the anterior peritoneum was entered sharply, and a Deaver retractor was placed underneath the bladder.  LigaSure instrument was then used to ligate across the uterosacral ligaments bilaterally and the uterine vasculature bilaterally up to the inferior portions of the broad ligament.  The uterus was then removed with the left tube and ovary intact.  The uterosacral ligaments were  identified, suture ligated with figure-of-eight 0 Monocryl suture.  The posterior peritoneum was then closed in pursestring fashion using 0 Monocryl suture.  The vagina was then reapproximated with figure-of-eight 0 Monocryl suture, plicating the uterosacral ligaments in the midline.  There was good support and good hemostasis noted of the vagina.  Foley catheter was placed with return of clear yellow urine.  Abdomen was then re-insufflated.  Legs were repositioned.  Laparoscope was used to systematically evaluate the abdomen and pelvis.  After suction irrigation with the Nezhat suction irrigator, good hemostasis was achieved at all levels, good peristalsis noted of the ureter, both left and right.  Minimal blood loss had been noted.  The abdomen was then desufflated.  Trocars removed.  The infraumbilical skin incision was closed with sutures of 0 Vicryl suture. The fascia with 3-0 Vicryl Rapide subcuticular suture.  The 5 mm site was closed with 3-0 Vicryl Rapide subcuticular suture.  Good cosmetic results were noted.  Incisions were injected with 0.25% Marcaine total 10 mL used.  The patient was then transferred to the recovery room in stable condition.  Sponge and instrument counts were normal x3. Estimated blood loss was 100 mL.  The patient received 2 g of cefepime preoperatively.  The patient will be admitted for overnight observation.     Monia Sabal Corinna Capra, M.D.     DCL/MEDQ  D:  05/06/2014  T:  05/06/2014  Job:  [id number]

## 2014-07-22 ENCOUNTER — Encounter (INDEPENDENT_AMBULATORY_CARE_PROVIDER_SITE_OTHER): Payer: Commercial Managed Care - HMO | Admitting: Surgery

## 2014-07-24 ENCOUNTER — Encounter (HOSPITAL_COMMUNITY): Payer: Self-pay | Admitting: Emergency Medicine

## 2014-07-24 ENCOUNTER — Emergency Department (HOSPITAL_COMMUNITY)
Admission: EM | Admit: 2014-07-24 | Discharge: 2014-07-24 | Disposition: A | Discharge status: Other Acute Inpatient Hospital | Payer: Medicare HMO | Attending: Emergency Medicine | Admitting: Emergency Medicine

## 2014-07-24 ENCOUNTER — Emergency Department (HOSPITAL_COMMUNITY): Payer: Medicare HMO

## 2014-07-24 DIAGNOSIS — R109 Unspecified abdominal pain: Secondary | ICD-10-CM | POA: Diagnosis not present

## 2014-07-24 DIAGNOSIS — I10 Essential (primary) hypertension: Secondary | ICD-10-CM | POA: Insufficient documentation

## 2014-07-24 DIAGNOSIS — R1013 Epigastric pain: Secondary | ICD-10-CM

## 2014-07-24 DIAGNOSIS — R1012 Left upper quadrant pain: Secondary | ICD-10-CM

## 2014-07-24 DIAGNOSIS — K5900 Constipation, unspecified: Secondary | ICD-10-CM

## 2014-07-24 DIAGNOSIS — N809 Endometriosis, unspecified: Secondary | ICD-10-CM | POA: Insufficient documentation

## 2014-07-24 LAB — URINE MICROSCOPIC-ADD ON

## 2014-07-24 LAB — URINALYSIS, ROUTINE W REFLEX MICROSCOPIC
Bilirubin Urine: NEGATIVE
GLUCOSE, UA: NEGATIVE mg/dL
HGB URINE DIPSTICK: NEGATIVE
Ketones, ur: NEGATIVE mg/dL
Nitrite: POSITIVE — AB
Protein, ur: NEGATIVE mg/dL
SPECIFIC GRAVITY, URINE: 1.021 (ref 1.005–1.030)
Urobilinogen, UA: 0.2 mg/dL (ref 0.0–1.0)
pH: 6 (ref 5.0–8.0)

## 2014-07-24 LAB — COMPREHENSIVE METABOLIC PANEL
ALBUMIN: 4 g/dL (ref 3.5–5.2)
ALT: 17 U/L (ref 0–35)
AST: 23 U/L (ref 0–37)
Alkaline Phosphatase: 56 U/L (ref 39–117)
Anion gap: 14 (ref 5–15)
BUN: 23 mg/dL (ref 6–23)
CALCIUM: 9.7 mg/dL (ref 8.4–10.5)
CO2: 23 meq/L (ref 19–32)
Chloride: 96 mEq/L (ref 96–112)
Creatinine, Ser: 0.86 mg/dL (ref 0.50–1.10)
GFR calc Af Amer: >90 mL/min (ref >90)
GFR calc non Af Amer: 82 mL/min — ABNORMAL LOW (ref >90)
Glucose, Bld: 141 mg/dL — ABNORMAL HIGH (ref 70–99)
Potassium: 4 mEq/L (ref 3.7–5.3)
SODIUM: 133 meq/L — AB (ref 137–147)
Total Bilirubin: 0.4 mg/dL (ref 0.3–1.2)
Total Protein: 7.4 g/dL (ref 6.0–8.3)

## 2014-07-24 LAB — CBC WITH DIFFERENTIAL/PLATELET
BASOS PCT: 1 % (ref 0–1)
Basophils Absolute: 0 10*3/uL (ref 0.0–0.1)
Eosinophils Absolute: 0.3 10*3/uL (ref 0.0–0.7)
Eosinophils Relative: 5 % (ref 0–5)
HCT: 35 % — ABNORMAL LOW (ref 36.0–46.0)
Hemoglobin: 11.6 g/dL — ABNORMAL LOW (ref 12.0–15.0)
LYMPHS ABS: 2.5 10*3/uL (ref 0.7–4.0)
Lymphocytes Relative: 40 % (ref 12–46)
MCH: 29.7 pg (ref 26.0–34.0)
MCHC: 33.1 g/dL (ref 30.0–36.0)
MCV: 89.7 fL (ref 78.0–100.0)
Monocytes Absolute: 0.6 10*3/uL (ref 0.1–1.0)
Monocytes Relative: 10 % (ref 3–12)
NEUTROS PCT: 44 % (ref 43–77)
Neutro Abs: 2.8 10*3/uL (ref 1.7–7.7)
PLATELETS: 384 10*3/uL (ref 150–400)
RBC: 3.9 MIL/uL (ref 3.87–5.11)
RDW: 13.8 % (ref 11.5–15.5)
WBC: 6.2 10*3/uL (ref 4.0–10.5)

## 2014-07-24 LAB — LIPASE, BLOOD: Lipase: 19 U/L (ref 11–59)

## 2014-07-24 MED ORDER — MILK AND MOLASSES ENEMA
1.0000 | Freq: Once | RECTAL | Status: AC
  Administered 2014-07-24: 250 mL via RECTAL
  Filled 2014-07-24: qty 250

## 2014-07-24 MED ORDER — ONDANSETRON HCL 4 MG/2ML IJ SOLN
4.0000 mg | Freq: Once | INTRAMUSCULAR | Status: AC
  Administered 2014-07-24: 4 mg via INTRAVENOUS
  Filled 2014-07-24: qty 2

## 2014-07-24 MED ORDER — SODIUM CHLORIDE 0.9 % IV BOLUS (SEPSIS)
500.0000 mL | Freq: Once | INTRAVENOUS | Status: AC
Start: 2014-07-24 — End: 2014-07-24
  Administered 2014-07-24: 500 mL via INTRAVENOUS

## 2014-07-24 MED ORDER — IOHEXOL 300 MG/ML  SOLN
100.0000 mL | Freq: Once | INTRAMUSCULAR | Status: AC | PRN
  Administered 2014-07-24: 100 mL via INTRAVENOUS

## 2014-07-24 MED ORDER — ALPRAZOLAM 0.5 MG PO TABS
0.5000 mg | ORAL_TABLET | Freq: Once | ORAL | Status: AC
  Administered 2014-07-24: 0.5 mg via ORAL
  Filled 2014-07-24: qty 1

## 2014-07-24 MED ORDER — HYDROMORPHONE HCL 1 MG/ML IJ SOLN
1.0000 mg | Freq: Once | INTRAMUSCULAR | Status: AC
  Administered 2014-07-24: 1 mg via INTRAVENOUS
  Filled 2014-07-24: qty 1

## 2014-07-24 NOTE — ED Provider Notes (Addendum)
Patient sent here from [facility] for abdominal CT due to worsening abdominal pain. Patient CT scan ordered and pending at this time  12:05 PM ] Patient had abdominal CT which showed constipation. She was given an enema here with a terrific result. She feels better at this time. Repeat abdominal exam at time of discharge and is nonsurgical.  Leota Jacobsen, MD 07/24/14 3235  Leota Jacobsen, MD 07/24/14 [phone]

## 2014-07-24 NOTE — ED Notes (Signed)
Sent pharmacy a request to expedite molasses

## 2014-07-24 NOTE — Discharge Instructions (Signed)
Abdominal Pain, Women °Abdominal (stomach, pelvic, or belly) pain can be caused by many things. It is important to tell your doctor: °· The location of the pain. °· Does it come and go or is it present all the time? °· Are there things that start the pain (eating certain foods, exercise)? °· Are there other symptoms associated with the pain (fever, nausea, vomiting, diarrhea)? °All of this is helpful to know when trying to find the cause of the pain. °CAUSES  °· Stomach: virus or bacteria infection, or ulcer. °· Intestine: appendicitis (inflamed appendix), regional ileitis (Crohn's disease), ulcerative colitis (inflamed colon), irritable bowel syndrome, diverticulitis (inflamed diverticulum of the colon), or cancer of the stomach or intestine. °· Gallbladder disease or stones in the gallbladder. °· Kidney disease, kidney stones, or infection. °· Pancreas infection or cancer. °· Fibromyalgia (pain disorder). °· Diseases of the female organs: °¨ Uterus: fibroid (non-cancerous) tumors or infection. °¨ Fallopian tubes: infection or tubal pregnancy. °¨ Ovary: cysts or tumors. °¨ Pelvic adhesions (scar tissue). °¨ Endometriosis (uterus lining tissue growing in the pelvis and on the pelvic organs). °¨ Pelvic congestion syndrome (female organs filling up with blood just before the menstrual period). °¨ Pain with the menstrual period. °¨ Pain with ovulation (producing an egg). °¨ Pain with an IUD (intrauterine device, birth control) in the uterus. °¨ Cancer of the female organs. °· Functional pain (pain not caused by a disease, may improve without treatment). °· Psychological pain. °· Depression. °DIAGNOSIS  °Your doctor will decide the seriousness of your pain by doing an examination. °· Blood tests. °· X-rays. °· Ultrasound. °· CT scan (computed tomography, special type of X-ray). °· MRI (magnetic resonance imaging). °· Cultures, for infection. °· Barium enema (dye inserted in the large intestine, to better view it with  X-rays). °· Colonoscopy (looking in intestine with a lighted tube). °· Laparoscopy (minor surgery, looking in abdomen with a lighted tube). °· Major abdominal exploratory surgery (looking in abdomen with a large incision). °TREATMENT  °The treatment will depend on the cause of the pain.  °· Many cases can be observed and treated at home. °· Over-the-counter medicines recommended by your caregiver. °· Prescription medicine. °· Antibiotics, for infection. °· Birth control pills, for painful periods or for ovulation pain. °· Hormone treatment, for endometriosis. °· Nerve blocking injections. °· Physical therapy. °· Antidepressants. °· Counseling with a psychologist or psychiatrist. °· Minor or major surgery. °HOME CARE INSTRUCTIONS  °· Do not take laxatives, unless directed by your caregiver. °· Take over-the-counter pain medicine only if ordered by your caregiver. Do not take aspirin because it can cause an upset stomach or bleeding. °· Try a clear liquid diet (broth or water) as ordered by your caregiver. Slowly move to a bland diet, as tolerated, if the pain is related to the stomach or intestine. °· Have a thermometer and take your temperature several times a day, and record it. °· Bed rest and sleep, if it helps the pain. °· Avoid sexual intercourse, if it causes pain. °· Avoid stressful situations. °· Keep your follow-up appointments and tests, as your caregiver orders. °· If the pain does not go away with medicine or surgery, you may try: °¨ Acupuncture. °¨ Relaxation exercises (yoga, meditation). °¨ Group therapy. °¨ Counseling. °SEEK MEDICAL CARE IF:  °· You notice certain foods cause stomach pain. °· Your home care treatment is not helping your pain. °· You need stronger pain medicine. °· You want your IUD removed. °· You feel faint or   lightheaded. °· You develop nausea and vomiting. °· You develop a rash. °· You are having side effects or an allergy to your medicine. °SEEK IMMEDIATE MEDICAL CARE IF:  °· Your  pain does not go away or gets worse. °· You have a fever. °· Your pain is felt only in portions of the abdomen. The right side could possibly be appendicitis. The left lower portion of the abdomen could be colitis or diverticulitis. °· You are passing blood in your stools (bright red or black tarry stools, with or without vomiting). °· You have blood in your urine. °· You develop chills, with or without a fever. °· You pass out. °MAKE SURE YOU:  °· Understand these instructions. °· Will watch your condition. °· Will get help right away if you are not doing well or get worse. °Document Released: 07/08/2007 Document Revised: 01/25/2014 Document Reviewed: 07/28/2009 °Exit[employer]® Patient Information ©2015 [employer]. This information is not intended to replace advice given to you by your health care provider. Make sure you discuss any questions you have with your health care provider. ° °Constipation °Constipation is when a person has fewer than three bowel movements a week, has difficulty having a bowel movement, or has stools that are dry, hard, or larger than normal. As people grow older, constipation is more common. If you try to fix constipation with medicines that make you have a bowel movement (laxatives), the problem may get worse. Long-term laxative use may cause the muscles of the colon to become weak. A low-fiber diet, not taking in enough fluids, and taking certain medicines may make constipation worse.  °CAUSES  °· Certain medicines, such as antidepressants, pain medicine, iron supplements, antacids, and water pills.   °· Certain diseases, such as diabetes, irritable bowel syndrome (IBS), thyroid disease, or depression.   °· Not drinking enough water.   °· Not eating enough fiber-rich foods.   °· Stress or travel.   °· Lack of physical activity or exercise.   °· Ignoring the urge to have a bowel movement.   °· Using laxatives too much.   °SIGNS AND SYMPTOMS  °· Having fewer than three bowel movements a  week.   °· Straining to have a bowel movement.   °· Having stools that are hard, dry, or larger than normal.   °· Feeling full or bloated.   °· Pain in the lower abdomen.   °· Not feeling relief after having a bowel movement.   °DIAGNOSIS  °Your health care provider will take a medical history and perform a physical exam. Further testing may be done for severe constipation. Some tests may include: °· A barium enema X-ray to examine your rectum, colon, and, sometimes, your small intestine.   °· A sigmoidoscopy to examine your lower colon.   °· A colonoscopy to examine your entire colon. °TREATMENT  °Treatment will depend on the severity of your constipation and what is causing it. Some dietary treatments include drinking more fluids and eating more fiber-rich foods. Lifestyle treatments may include regular exercise. If these diet and lifestyle recommendations do not help, your health care provider may recommend taking over-the-counter laxative medicines to help you have bowel movements. Prescription medicines may be prescribed if over-the-counter medicines do not work.  °HOME CARE INSTRUCTIONS  °· Eat foods that have a lot of fiber, such as fruits, vegetables, whole grains, and beans. °· Limit foods high in fat and processed sugars, such as french fries, hamburgers, cookies, candies, and soda.   °· A fiber supplement may be added to your diet if you cannot get enough fiber from foods.   °·   Drink enough fluids to keep your urine clear or pale yellow.   °· Exercise regularly or as directed by your health care provider.   °· Go to the restroom when you have the urge to go. Do not hold it.   °· Only take over-the-counter or prescription medicines as directed by your health care provider. Do not take other medicines for constipation without talking to your health care provider first.   °SEEK IMMEDIATE MEDICAL CARE IF:  °· You have bright red blood in your stool.   °· Your constipation lasts for more than 4 days or gets  worse.   °· You have abdominal or rectal pain.   °· You have thin, pencil-like stools.   °· You have unexplained weight loss. °MAKE SURE YOU:  °· Understand these instructions. °· Will watch your condition. °· Will get help right away if you are not doing well or get worse. °Document Released: 06/08/2004 Document Revised: 09/15/2013 Document Reviewed: 06/22/2013 °Exit[employer]® Patient Information ©2015 [employer]. This information is not intended to replace advice given to you by your health care provider. Make sure you discuss any questions you have with your health care provider. ° °

## 2014-07-24 NOTE — ED Provider Notes (Signed)
CSN: [id number]     Arrival date & time 07/24/14  0555 History   First MD Initiated Contact with Patient 07/24/14 0606     Chief Complaint  Patient presents with  . Abdominal Pain     (Consider location/radiation/quality/duration/timing/severity/associated sxs/prior Treatment) HPI This 43 year old female with a history of multiple abdominal surgeries including Roux-en-Y, hysterectomy and repair of internal hernia with lysis of adhesions. She is on chronic narcotics for chronic pain of several etiologies. She has chronic left upper quadrant and lower substernal pain which she states has been present since April. She is here with a 5 day history of worsening of this pain. She rates the pain as an 8 out of 10. It is worse with movement or palpation. She has had nausea and vomiting for the past 2 days. She states she is constipated. She feels that her abdomen is distended. She also suspects she has a urinary tract infection due to discomfort with urination. She's been taking over-the-counter Azo for this.  Past Medical History  Diagnosis Date  . Endometriosis   . Pelvic pain   . Ovarian cyst     RECURRENT  . IC (interstitial cystitis)   . Migraine   . Chronic back pain   . Chronic nausea     POST GASTRIC BYPASS  . S/P gastric bypass     2004  . Hypertension   . Depression   . Anemia   . H/O hiatal hernia   . Self-catheterizes urinary bladder   . Pulse fast     "with pain"   Past Surgical History  Procedure Laterality Date  . Hysteroscopy w/d&c  02-10-2003  . Roux-en-y procedure  06-14-2003  . Cystourethroscopy/  hydodistention/ bladder bx  01-02-2006  . Endometrial ablation w/ novasure  2007  . Laparoscopy N/A 09/11/2013    Procedure: LAPAROSCOPY DIAGNOSTIC , lysis of adhesions, ablation of endometrious;  Surgeon: Luz Lex, MD;  Location: [facility];  Service: Gynecology;  Laterality: N/A;  . Salpingoophorectomy Right 09/11/2013    Procedure: SALPINGO  OOPHORECTOMY;  Surgeon: Luz Lex, MD;  Location: [facility];  Service: Gynecology;  Laterality: Right;  . Tens unit    . Esophagogastroduodenoscopy N/A 02/23/2014    Procedure: ESOPHAGOGASTRODUODENOSCOPY (EGD);  Surgeon: Daneil Dolin, MD;  Location: [facility];  Service: Endoscopy;  Laterality: N/A;  1:15-moved to [place] to notify pt  . Gastric bypass    . Dilation and curettage of uterus    . Laparoscopy N/A 04/03/2014    Procedure: DIAGNOSTIC LAPAROSCOPY, REDUCTION AND REPAIR OF INTERNAL HERNIA;  Surgeon: Shann Medal, MD;  Location: [facility];  Service: General;  Laterality: N/A;  . Laparoscopic assisted vaginal hysterectomy N/A 05/06/2014    Procedure: LAPAROSCOPIC ASSISTED VAGINAL HYSTERECTOMY ;  Surgeon: Luz Lex, MD;  Location: [facility];  Service: Gynecology;  Laterality: N/A;  . Salpingoophorectomy Left 05/06/2014    Procedure: SALPINGO OOPHORECTOMY;  Surgeon: Luz Lex, MD;  Location: [facility];  Service: Gynecology;  Laterality: Left;  . Laparoscopic lysis of adhesions N/A 05/06/2014    Procedure: LAPAROSCOPIC LYSIS OF ADHESIONS;  Surgeon: Luz Lex, MD;  Location: [facility];  Service: Gynecology;  Laterality: N/A;   Family History  Problem Relation Age of Onset  . Colon cancer Neg Hx   . Heart disease Maternal Grandmother    History  Substance Use Topics  . Smoking status: Never Smoker   . Smokeless tobacco: Never Used  .  Alcohol Use: No   OB History   Grav Para Term Preterm Abortions TAB SAB Ect Mult Living   1 1 1       1      Review of Systems  All other systems reviewed and are negative.   Allergies  Codeine; Nsaids; and Provera  Home Medications   Prior to Admission medications   Medication Sig Start Date End Date Taking? Authorizing Provider  amitriptyline (ELAVIL) 150 MG tablet Take 75 mg by mouth 2 (two) times daily.    Yes Historical Provider, MD  calcium carbonate (TUMS - DOSED IN MG ELEMENTAL CALCIUM) 500 MG chewable tablet  Chew 2 tablets by mouth 2 (two) times daily.    Yes Historical Provider, MD  clonazePAM (KLONOPIN) 1 MG tablet Take 1 mg by mouth 3 (three) times daily.   Yes Historical Provider, MD  cyclobenzaprine (FLEXERIL) 10 MG tablet Take 10 mg by mouth 3 (three) times daily as needed for muscle spasms.   Yes Historical Provider, MD  lisinopril-hydrochlorothiazide (PRINZIDE,ZESTORETIC) 20-25 MG per tablet Take 1 tablet by mouth every morning.   Yes Historical Provider, MD  PRESCRIPTION MEDICATION 23 mLs by Intracatheter route daily as needed (for bladder pain). Sodium Bicarb, Heparin, Lidocaine: combination inserted thru catheter into bladder.   Yes Historical Provider, MD  sulfamethoxazole-trimethoprim (BACTRIM DS) 800-160 MG per tablet Take 1 tablet by mouth 2 (two) times daily as needed (For Catheter). Prevent infection   Yes Historical Provider, MD  ALPRAZolam (XANAX) 0.5 MG tablet Take 1 tablet (0.5 mg total) by mouth at bedtime as needed for anxiety (every eight hrs). 05/07/14   Luz Lex, MD  ondansetron (ZOFRAN) 4 MG tablet Take 1 tablet (4 mg total) by mouth every 8 (eight) hours as needed for nausea or vomiting. 01/06/14   Janice Norrie, MD  oxyCODONE (ROXICODONE) 5 MG immediate release tablet Take 1 tablet (5 mg total) by mouth every 4 (four) hours as needed for severe pain. 03/15/14   Kathie Dike Leftwich-Kirby, CNM  oxyCODONE-acetaminophen (PERCOCET/ROXICET) 5-325 MG per tablet Take 1-2 tablets by mouth every 4 (four) hours as needed for severe pain (moderate to severe pain (when tolerating fluids)). 05/07/14   Luz Lex, MD  zolpidem (AMBIEN) 10 MG tablet Take 10 mg by mouth at bedtime as needed for sleep.     Historical Provider, MD   BP 90/60  Pulse 107  Temp(Src) 98.2 F (36.8 C) (Oral)  Resp 18  Ht 5' 6.5" (1.689 m)  Wt 218 lb (98.884 kg)  BMI 34.66 kg/m2  SpO2 95%  LMP 07/24/2014  Physical Exam General: Well-developed, well-nourished female in no acute distress; appearance consistent with  age of record HENT: normocephalic; atraumatic; mucous membranes dry Eyes: pupils equal, round and reactive to light; extraocular muscles intact Neck: supple Heart: regular rate and rhythm Lungs: clear to auscultation bilaterally Abdomen: soft; no appreciable distention; epigastric and left upper quadrant tenderness; no masses or hepatosplenomegaly; bowel sounds hypoactive Extremities: No deformity; full range of motion; pulses normal Neurologic: Awake, alert and oriented; motor function intact in all extremities and symmetric; no facial droop Skin: Warm and dry Psychiatric: Anxious   ED Course  Procedures (including critical care time)   MDM  Nursing notes and vitals signs, including pulse oximetry, reviewed.  Summary of this visit's results, reviewed by myself:  6:32 AM Our CT scanner is out of commission and will likely be down most of the day. We will initiate hydration and treatment of pain and  nausea and transfer the patient to [facility] for CT scan, as her complicated surgical history necessitates a CT. Dr. Lita Mains (EDP) and Nilsa Nutting (Charge Nurse) made aware of this transfer.      Wynetta Fines, MD 07/24/14 [phone]

## 2014-07-24 NOTE — ED Notes (Signed)
Bed: WA09 Expected date:  Expected time:  Means of arrival:  Comments: Tx from [place] CT scan for abdominal pain

## 2014-07-24 NOTE — ED Notes (Signed)
Pt comes from [facility] for abd CT due to their's being down at this time.  Pt got LUQ pain that started bad on Monday and intolerable yesterday.

## 2014-07-24 NOTE — ED Notes (Signed)
Pt reporting abdominal and epigastric pain off and on for about a week. Reporting nausea and vomiting for about 2 days.

## 2014-07-26 ENCOUNTER — Encounter (HOSPITAL_COMMUNITY): Payer: Self-pay | Admitting: Emergency Medicine

## 2014-08-17 ENCOUNTER — Emergency Department (HOSPITAL_COMMUNITY)
Admission: EM | Admit: 2014-08-17 | Discharge: 2014-08-17 | Disposition: A | Discharge status: Home / Self Care | Payer: Medicare HMO | Attending: Emergency Medicine | Admitting: Emergency Medicine

## 2014-08-17 DIAGNOSIS — Z79899 Other long term (current) drug therapy: Secondary | ICD-10-CM | POA: Insufficient documentation

## 2014-08-17 DIAGNOSIS — I10 Essential (primary) hypertension: Secondary | ICD-10-CM | POA: Diagnosis not present

## 2014-08-17 DIAGNOSIS — Z9884 Bariatric surgery status: Secondary | ICD-10-CM | POA: Insufficient documentation

## 2014-08-17 DIAGNOSIS — F329 Major depressive disorder, single episode, unspecified: Secondary | ICD-10-CM | POA: Diagnosis not present

## 2014-08-17 DIAGNOSIS — Z7982 Long term (current) use of aspirin: Secondary | ICD-10-CM | POA: Diagnosis not present

## 2014-08-17 DIAGNOSIS — R1084 Generalized abdominal pain: Secondary | ICD-10-CM | POA: Diagnosis not present

## 2014-08-17 DIAGNOSIS — Z862 Personal history of diseases of the blood and blood-forming organs and certain disorders involving the immune mechanism: Secondary | ICD-10-CM | POA: Diagnosis not present

## 2014-08-17 DIAGNOSIS — R197 Diarrhea, unspecified: Secondary | ICD-10-CM | POA: Diagnosis present

## 2014-08-17 DIAGNOSIS — Z8742 Personal history of other diseases of the female genital tract: Secondary | ICD-10-CM | POA: Diagnosis not present

## 2014-08-17 DIAGNOSIS — N390 Urinary tract infection, site not specified: Secondary | ICD-10-CM | POA: Insufficient documentation

## 2014-08-17 DIAGNOSIS — G8929 Other chronic pain: Secondary | ICD-10-CM | POA: Insufficient documentation

## 2014-08-17 DIAGNOSIS — Z9889 Other specified postprocedural states: Secondary | ICD-10-CM | POA: Diagnosis not present

## 2014-08-17 LAB — COMPREHENSIVE METABOLIC PANEL
ALK PHOS: 67 U/L (ref 39–117)
ALT: 23 U/L (ref 0–35)
AST: 21 U/L (ref 0–37)
Albumin: 4.1 g/dL (ref 3.5–5.2)
Anion gap: 16 — ABNORMAL HIGH (ref 5–15)
BUN: 15 mg/dL (ref 6–23)
CHLORIDE: 96 meq/L (ref 96–112)
CO2: 23 meq/L (ref 19–32)
CREATININE: 0.86 mg/dL (ref 0.50–1.10)
Calcium: 10.5 mg/dL (ref 8.4–10.5)
GFR calc Af Amer: >90 mL/min (ref >90)
GFR, EST NON AFRICAN AMERICAN: 82 mL/min — AB (ref >90)
Glucose, Bld: 99 mg/dL (ref 70–99)
POTASSIUM: 4.1 meq/L (ref 3.7–5.3)
Sodium: 135 mEq/L — ABNORMAL LOW (ref 137–147)
Total Protein: 8 g/dL (ref 6.0–8.3)

## 2014-08-17 LAB — CBC WITH DIFFERENTIAL/PLATELET
Basophils Absolute: 0 10*3/uL (ref 0.0–0.1)
Basophils Relative: 1 % (ref 0–1)
Eosinophils Absolute: 0.2 10*3/uL (ref 0.0–0.7)
Eosinophils Relative: 5 % (ref 0–5)
HEMATOCRIT: 34.1 % — AB (ref 36.0–46.0)
HEMOGLOBIN: 11.2 g/dL — AB (ref 12.0–15.0)
Lymphocytes Relative: 33 % (ref 12–46)
Lymphs Abs: 1.7 10*3/uL (ref 0.7–4.0)
MCH: 30.5 pg (ref 26.0–34.0)
MCHC: 32.8 g/dL (ref 30.0–36.0)
MCV: 92.9 fL (ref 78.0–100.0)
MONO ABS: 0.5 10*3/uL (ref 0.1–1.0)
MONOS PCT: 9 % (ref 3–12)
NEUTROS ABS: 2.7 10*3/uL (ref 1.7–7.7)
Neutrophils Relative %: 52 % (ref 43–77)
Platelets: 377 10*3/uL (ref 150–400)
RBC: 3.67 MIL/uL — ABNORMAL LOW (ref 3.87–5.11)
RDW: 12.9 % (ref 11.5–15.5)
WBC: 5.2 10*3/uL (ref 4.0–10.5)

## 2014-08-17 LAB — LIPASE, BLOOD: Lipase: 19 U/L (ref 11–59)

## 2014-08-17 LAB — URINALYSIS, ROUTINE W REFLEX MICROSCOPIC
Bilirubin Urine: NEGATIVE
GLUCOSE, UA: NEGATIVE mg/dL
Ketones, ur: NEGATIVE mg/dL
Nitrite: POSITIVE — AB
PH: 6.5 (ref 5.0–8.0)
Protein, ur: NEGATIVE mg/dL
SPECIFIC GRAVITY, URINE: 1.015 (ref 1.005–1.030)
UROBILINOGEN UA: 0.2 mg/dL (ref 0.0–1.0)

## 2014-08-17 LAB — I-STAT CG4 LACTIC ACID, ED: LACTIC ACID, VENOUS: 0.74 mmol/L (ref 0.5–2.2)

## 2014-08-17 LAB — URINE MICROSCOPIC-ADD ON

## 2014-08-17 MED ORDER — ONDANSETRON HCL 4 MG PO TABS: 4.0000 mg | ORAL_TABLET | Freq: Four times a day (QID) | ORAL | Status: AC

## 2014-08-17 MED ORDER — CEPHALEXIN 500 MG PO CAPS: 500.0000 mg | ORAL_CAPSULE | Freq: Four times a day (QID) | ORAL | Status: AC

## 2014-08-17 MED ORDER — ONDANSETRON HCL 4 MG/2ML IJ SOLN
4.0000 mg | Freq: Once | INTRAMUSCULAR | Status: AC
  Administered 2014-08-17: 4 mg via INTRAVENOUS
  Filled 2014-08-17: qty 2

## 2014-08-17 MED ORDER — SACCHAROMYCES BOULARDII 250 MG PO CAPS: 250.0000 mg | ORAL_CAPSULE | Freq: Two times a day (BID) | ORAL | Status: AC

## 2014-08-17 MED ORDER — ONDANSETRON 8 MG PO TBDP: 8.0000 mg | ORAL_TABLET | Freq: Three times a day (TID) | ORAL | Status: AC | PRN

## 2014-08-17 NOTE — Discharge Instructions (Signed)
Abdominal Pain °Many things can cause abdominal pain. Usually, abdominal pain is not caused by a disease and will improve without treatment. It can often be observed and treated at home. Your health care provider will do a physical exam and possibly order blood tests and X-rays to help determine the seriousness of your pain. However, in many cases, more time must pass before a clear cause of the pain can be found. Before that point, your health care provider may not know if you need more testing or further treatment. °HOME CARE INSTRUCTIONS  °Monitor your abdominal pain for any changes. The following actions may help to alleviate any discomfort you are experiencing: °· Only take over-the-counter or prescription medicines as directed by your health care provider. °· Do not take laxatives unless directed to do so by your health care provider. °· Try a clear liquid diet (broth, tea, or water) as directed by your health care provider. Slowly move to a bland diet as tolerated. °SEEK MEDICAL CARE IF: °· You have unexplained abdominal pain. °· You have abdominal pain associated with nausea or diarrhea. °· You have pain when you urinate or have a bowel movement. °· You experience abdominal pain that wakes you in the night. °· You have abdominal pain that is worsened or improved by eating food. °· You have abdominal pain that is worsened with eating fatty foods. °· You have a fever. °SEEK IMMEDIATE MEDICAL CARE IF:  °· Your pain does not go away within 2 hours. °· You keep throwing up (vomiting). °· Your pain is felt only in portions of the abdomen, such as the right side or the left lower portion of the abdomen. °· You pass bloody or black tarry stools. °MAKE SURE YOU: °· Understand these instructions.   °· Will watch your condition.   °· Will get help right away if you are not doing well or get worse.   °Document Released: 06/20/2005 Document Revised: 09/15/2013 Document Reviewed: 05/20/2013 °ExitCare® Patient Information  ©2015 [employer]. This information is not intended to replace advice given to you by your health care provider. Make sure you discuss any questions you have with your health care provider. °Urinary Tract Infection °Urinary tract infections (UTIs) can develop anywhere along your urinary tract. Your urinary tract is your body's drainage system for removing wastes and extra water. Your urinary tract includes two kidneys, two ureters, a bladder, and a urethra. Your kidneys are a pair of bean-shaped organs. Each kidney is about the size of your fist. They are located below your ribs, one on each side of your spine. °CAUSES °Infections are caused by microbes, which are microscopic organisms, including fungi, viruses, and bacteria. These organisms are so small that they can only be seen through a microscope. Bacteria are the microbes that most commonly cause UTIs. °SYMPTOMS  °Symptoms of UTIs may vary by age and gender of the patient and by the location of the infection. Symptoms in young women typically include a frequent and intense urge to urinate and a painful, burning feeling in the bladder or urethra during urination. Older women and men are more likely to be tired, shaky, and weak and have muscle aches and abdominal pain. A fever may mean the infection is in your kidneys. Other symptoms of a kidney infection include pain in your back or sides below the ribs, nausea, and vomiting. °DIAGNOSIS °To diagnose a UTI, your caregiver will ask you about your symptoms. Your caregiver also will ask to provide a urine sample. The urine sample   will be tested for bacteria and white blood cells. White blood cells are made by your body to help fight infection. °TREATMENT  °Typically, UTIs can be treated with medication. Because most UTIs are caused by a bacterial infection, they usually can be treated with the use of antibiotics. The choice of antibiotic and length of treatment depend on your symptoms and the type of bacteria  causing your infection. °HOME CARE INSTRUCTIONS °· If you were prescribed antibiotics, take them exactly as your caregiver instructs you. Finish the medication even if you feel better after you have only taken some of the medication. °· Drink enough water and fluids to keep your urine clear or pale yellow. °· Avoid caffeine, tea, and carbonated beverages. They tend to irritate your bladder. °· Empty your bladder often. Avoid holding urine for long periods of time. °· Empty your bladder before and after sexual intercourse. °· After a bowel movement, women should cleanse from front to back. Use each tissue only once. °SEEK MEDICAL CARE IF:  °· You have back pain. °· You develop a fever. °· Your symptoms do not begin to resolve within 3 days. °SEEK IMMEDIATE MEDICAL CARE IF:  °· You have severe back pain or lower abdominal pain. °· You develop chills. °· You have nausea or vomiting. °· You have continued burning or discomfort with urination. °MAKE SURE YOU:  °· Understand these instructions. °· Will watch your condition. °· Will get help right away if you are not doing well or get worse. °Document Released: 06/20/2005 Document Revised: 03/11/2012 Document Reviewed: 10/19/2011 °Exit[employer]® Patient Information ©2015 [employer]. This information is not intended to replace advice given to you by your health care provider. Make sure you discuss any questions you have with your health care provider. ° °

## 2014-08-17 NOTE — ED Provider Notes (Signed)
CSN: [id number]     Arrival date & time 08/17/14  0741 History   First MD Initiated Contact with Patient 08/17/14 0755     Chief Complaint  Patient presents with  . Nausea  . Diarrhea     (Consider location/radiation/quality/duration/timing/severity/associated sxs/prior Treatment) HPI Comments: Patient presents to the ER for evaluation of nausea, intermittent vomiting, anal seepage and abdominal pain. Symptoms have been ongoing for the last several months. Patient has a complex past medical history including gastric bypass requiring revision and total abdominal hysterectomy.  Patient reports diffuse abdominal discomfort and inability to eat. She reports that she has lost approximately 10 pounds over the last several weeks. She thinks she has had low-grade fevers.  Patient is a 43 y.o. female presenting with diarrhea.  Diarrhea Associated symptoms: abdominal pain and fever     Past Medical History  Diagnosis Date  . Endometriosis   . Pelvic pain   . Ovarian cyst     RECURRENT  . IC (interstitial cystitis)   . Migraine   . Chronic back pain   . Chronic nausea     POST GASTRIC BYPASS  . S/P gastric bypass     2004  . Hypertension   . Depression   . Anemia   . H/O hiatal hernia   . Self-catheterizes urinary bladder   . Pulse fast     "with pain"   Past Surgical History  Procedure Laterality Date  . Hysteroscopy w/d&c  02-10-2003  . Roux-en-y procedure  06-14-2003  . Cystourethroscopy/  hydodistention/ bladder bx  01-02-2006  . Endometrial ablation w/ novasure  2007  . Laparoscopy N/A 09/11/2013    Procedure: LAPAROSCOPY DIAGNOSTIC , lysis of adhesions, ablation of endometrious;  Surgeon: Luz Lex, MD;  Location: [facility];  Service: Gynecology;  Laterality: N/A;  . Salpingoophorectomy Right 09/11/2013    Procedure: SALPINGO OOPHORECTOMY;  Surgeon: Luz Lex, MD;  Location: [facility];  Service: Gynecology;  Laterality: Right;  .  Tens unit    . Esophagogastroduodenoscopy N/A 02/23/2014    Procedure: ESOPHAGOGASTRODUODENOSCOPY (EGD);  Surgeon: Daneil Dolin, MD;  Location: [facility];  Service: Endoscopy;  Laterality: N/A;  1:15-moved to [place] to notify pt  . Gastric bypass    . Dilation and curettage of uterus    . Laparoscopy N/A 04/03/2014    Procedure: DIAGNOSTIC LAPAROSCOPY, REDUCTION AND REPAIR OF INTERNAL HERNIA;  Surgeon: Shann Medal, MD;  Location: [facility];  Service: General;  Laterality: N/A;  . Laparoscopic assisted vaginal hysterectomy N/A 05/06/2014    Procedure: LAPAROSCOPIC ASSISTED VAGINAL HYSTERECTOMY ;  Surgeon: Luz Lex, MD;  Location: [facility];  Service: Gynecology;  Laterality: N/A;  . Salpingoophorectomy Left 05/06/2014    Procedure: SALPINGO OOPHORECTOMY;  Surgeon: Luz Lex, MD;  Location: [facility];  Service: Gynecology;  Laterality: Left;  . Laparoscopic lysis of adhesions N/A 05/06/2014    Procedure: LAPAROSCOPIC LYSIS OF ADHESIONS;  Surgeon: Luz Lex, MD;  Location: [facility];  Service: Gynecology;  Laterality: N/A;   Family History  Problem Relation Age of Onset  . Colon cancer Neg Hx   . Heart disease Maternal Grandmother    History  Substance Use Topics  . Smoking status: Never Smoker   . Smokeless tobacco: Never Used  . Alcohol Use: No   OB History    Gravida Para Term Preterm AB TAB SAB Ectopic Multiple Living   1 1 1  1     Review of Systems  Constitutional: Positive for fever.  Gastrointestinal: Positive for nausea, abdominal pain and diarrhea.  All other systems reviewed and are negative.     Allergies  Codeine; Estradiol; Nsaids; and Provera  Home Medications   Prior to Admission medications   Medication Sig Start Date End Date Taking? Authorizing Provider  amitriptyline (ELAVIL) 150 MG tablet Take 150 mg by mouth every evening.    Yes Historical Provider, MD  calcium carbonate (TUMS - DOSED IN MG ELEMENTAL CALCIUM) 500 MG chewable tablet Chew 2  tablets by mouth 4 (four) times daily.    Yes Historical Provider, MD  cholecalciferol (VITAMIN D) 1000 UNITS tablet Take 1,000 Units by mouth daily.   Yes Historical Provider, MD  clonazePAM (KLONOPIN) 1 MG tablet Take 1 mg by mouth 3 (three) times daily.   Yes Historical Provider, MD  HYDROcodone-acetaminophen (NORCO) 10-325 MG per tablet Take 1 tablet by mouth every 6 (six) hours as needed for moderate pain or severe pain.   Yes Historical Provider, MD  lisinopril-hydrochlorothiazide (PRINZIDE,ZESTORETIC) 20-25 MG per tablet Take 1 tablet by mouth every morning.   Yes Historical Provider, MD  Multiple Vitamin (MULTIVITAMIN WITH MINERALS) TABS tablet Take 1 tablet by mouth 2 (two) times daily. gummies   Yes Historical Provider, MD  PRESCRIPTION MEDICATION 23 mLs by Intracatheter route daily as needed (for bladder pain). Sodium Bicarb, Heparin, Lidocaine: combination inserted thru catheter into bladder.   Yes Historical Provider, MD  sulfamethoxazole-trimethoprim (BACTRIM DS) 800-160 MG per tablet Take 1 tablet by mouth daily. Prevent infection   Yes Historical Provider, MD  vitamin B-12 (CYANOCOBALAMIN) 1000 MCG tablet Take 1,000 mcg by mouth daily.   Yes Historical Provider, MD  oxyCODONE (ROXICODONE) 5 MG immediate release tablet Take 1 tablet (5 mg total) by mouth every 4 (four) hours as needed for severe pain. Patient not taking: Reported on 08/17/2014 03/15/14   Lattie Haw A Leftwich-Kirby, CNM   BP 105/47 mmHg  Pulse 89  Temp(Src) 98.1 F (36.7 C) (Oral)  Resp 18  Ht 5\' 6"  (1.676 m)  Wt 150 lb (68.04 kg)  BMI 24.22 kg/m2  SpO2 97%  LMP 07/24/2014 Physical Exam  Constitutional: She is oriented to person, place, and time. She appears well-developed and well-nourished. No distress.  HENT:  Head: Normocephalic and atraumatic.  Right Ear: Hearing normal.  Left Ear: Hearing normal.  Nose: Nose normal.  Mouth/Throat: Oropharynx is clear and moist and mucous membranes are normal.  Eyes:  Conjunctivae and EOM are normal. Pupils are equal, round, and reactive to light.  Neck: Normal range of motion. Neck supple.  Cardiovascular: Regular rhythm, S1 normal and S2 normal.  Exam reveals no gallop and no friction rub.   No murmur heard. Pulmonary/Chest: Effort normal and breath sounds normal. No respiratory distress. She exhibits no tenderness.  Abdominal: Soft. Normal appearance and bowel sounds are normal. There is no hepatosplenomegaly. There is tenderness. There is no rebound, no guarding, no tenderness at McBurney's point and negative Murphy's sign. No hernia.  Musculoskeletal: Normal range of motion.  Neurological: She is alert and oriented to person, place, and time. She has normal strength. No cranial nerve deficit or sensory deficit. Coordination normal. GCS eye subscore is 4. GCS verbal subscore is 5. GCS motor subscore is 6.  Skin: Skin is warm, dry and intact. No rash noted. No cyanosis.  Psychiatric: She has a normal mood and affect. Her speech is normal and behavior is normal. Thought content  normal.  Nursing note and vitals reviewed.   ED Course  Procedures (including critical care time) Labs Review Labs Reviewed  CBC WITH DIFFERENTIAL - Abnormal; Notable for the following:    RBC 3.67 (*)    Hemoglobin 11.2 (*)    HCT 34.1 (*)    All other components within normal limits  COMPREHENSIVE METABOLIC PANEL - Abnormal; Notable for the following:    Sodium 135 (*)    Total Bilirubin <0.2 (*)    GFR calc non Af Amer 82 (*)    Anion gap 16 (*)    All other components within normal limits  URINALYSIS, ROUTINE W REFLEX MICROSCOPIC - Abnormal; Notable for the following:    APPearance CLOUDY (*)    Hgb urine dipstick TRACE (*)    Nitrite POSITIVE (*)    Leukocytes, UA MODERATE (*)    All other components within normal limits  URINE MICROSCOPIC-ADD ON - Abnormal; Notable for the following:    Bacteria, UA MANY (*)    All other components within normal limits   CLOSTRIDIUM DIFFICILE BY PCR  LIPASE, BLOOD  I-STAT CG4 LACTIC ACID, ED    Imaging Review No results found.   EKG Interpretation None      MDM   Final diagnoses:  None  abd pain Diarrhea UTI  Patient presents to the ER for evaluation of abdominal pain and loose stools. Patient does have a complicated surgical history. Examination revealed mild diffuse tenderness without guarding or rebound. Vital signs are stable. Patient reports weight loss and possible dehydration. She was given IV fluids here in the ER. I did consult general surgery. Dr. Lucia Gaskins, general surgery, has seen the patient here in the ER. He did not feel that the patient required any additional imaging. Recommended no further therapy at this time. C. difficile will be sent because patient has had some loose stools and has used antibiotics recently, but it does not clinically fit.  Patient's workup did reveal evidence of urinary tract infection. This will require treatment. Treatment with Keflex. Culture pending. Patient can follow-up with Dr. Lucia Gaskins in the office.    Orpah Greek, MD 08/17/14 1505

## 2014-08-17 NOTE — ED Notes (Signed)
Ice chips given as ok by EDP.  Pt getting impatient with the wait.  Comfort measures provided.  Lights out and call bell in reach.

## 2014-08-17 NOTE — Consult Note (Signed)
Consultation note  Referring physician:  Dr. Frederich Cha, [facility]  Re: Jordan Velez DOB: 05/11/1971 MRN: [id number]  ASSESSMENT AND PLAN: 1.  Vague GI symptoms of nausea, abdominal pain, and loose stools - came to the [place] okay, CT scan on 07/24/2014 showed no internal hernia or abnormality, Upper endo by Dr. Buford Dresser in June 2015 showed normal gastric pouch  For now will treat symptomatically - she is difficult to assess because of the multiple complaints.  She has been on antibiotics chronically for bladder symptoms - will check stool for C. Diff. (I doubt that this is positive).  I discussed my finding with Dr. Malachy Moan  She will see me back in Jan 2016.  2. Internal hernia with small bowel through hernia - repaired 04/03/2014 - D. Jie Stickels  3. S/P RY Gastric Bypass for morbid obesity - 2004 - D. Dj Senteno  Weight in July 2015 - 222, BMI - 36.9 4. Chronic pelvic pain - IC Saw Dr. Marilynn Rail - 08/12/2014  She self installs - sodium bicarb/lidocaine/heparin - bladder irrigation  5. Had hysterectomy 05/06/2014 by Dr. Rana Snare to have done okay with this - though she has her complaints.  6. Chronic pain issues  Takes Hydrocodone 10mg  4 times  per day (She tried Oxycodone, but she did not sleep as well - so she is back on hydrocodone)  7. TENS unit to right sacral nerves for pelvic pain control  Placed Jan 2015 by Dr. Amalia Hailey. 8. Scoliosis But sees no ortho doctor 9.  History of depression  HISTORY OF PRESENT ILLNESS: Chief Complaint  Patient presents with  . Routine Post Op    hernia   Jordan Velez is a 43 y.o. (DOB: 1970-10-03) white female who is a patient of REDMON,NOELLE, PA-C and comes to the [facility] today with multiple complaints.  She has a list of problems:  1)  She has been vomiting, "fever", and multiple stools.  She says that she has "anal leakage" - multiple  times per day.    2)  She is not sleeping  3)  She's worried about a hernia and lifting - I tried to reassure that an internal hernia has nothing to do with lifting.   It is difficult to separate real problems that Ms. Wison is having from the her chronic problems. She talks rapidly and jumps around with her symptoms.  She talked about her symptoms - vomiting, abdominal pain, and frequent stools with anal leakage beginning last Friday, 11/20.  She tried taking TUMS and PeptoBismol without improvement.   She saw Dr. Amalia Hailey on Thursday and she had none of these symptoms.  He did a cystoscopy in the office and said that her bladder was unchanged. She is a very difficult patient to evaluate and manage.   She had a negative upper endo by Dr. Buford Dresser in June 2015.  She had a CT scan for abdominal pain on 07/24/2014 which turned out to be constipation.  I reviewed this CT scan and it showed no evidence of internal hernia (in contrast to the CT scan of 02/26/2014).  Past Medical History  Diagnosis Date  . Endometriosis   . Pelvic pain   . Ovarian cyst     RECURRENT  . IC (interstitial cystitis)   . Migraine   . Chronic back pain   . Chronic nausea     POST GASTRIC BYPASS  . S/P gastric bypass     2004  . Hypertension   .  Depression   . Anemia   . H/O hiatal hernia   . Self-catheterizes urinary bladder   . Pulse fast     "with pain"    No current facility-administered medications for this encounter.   Current Outpatient Prescriptions  Medication Sig Dispense Refill  . amitriptyline (ELAVIL) 150 MG tablet Take 150 mg by mouth every evening.     . calcium carbonate (TUMS - DOSED IN MG ELEMENTAL CALCIUM) 500 MG chewable tablet Chew 2 tablets by mouth 4 (four) times daily.     . cholecalciferol (VITAMIN D) 1000 UNITS tablet Take 1,000 Units by mouth daily.    . clonazePAM (KLONOPIN) 1 MG tablet Take 1 mg by mouth 3 (three) times daily.     Marland Kitchen HYDROcodone-acetaminophen (NORCO) 10-325 MG per tablet Take 1 tablet by mouth every 6 (six) hours as needed for moderate pain or severe pain.    Marland Kitchen lisinopril-hydrochlorothiazide (PRINZIDE,ZESTORETIC) 20-25 MG per tablet Take 1 tablet by mouth every morning.    . Multiple Vitamin (MULTIVITAMIN WITH MINERALS) TABS tablet Take 1 tablet by mouth 2 (two) times daily. gummies    . PRESCRIPTION MEDICATION 23 mLs by Intracatheter route daily as needed (for bladder pain). Sodium Bicarb, Heparin, Lidocaine: combination inserted thru catheter into bladder.    . sulfamethoxazole-trimethoprim (BACTRIM DS) 800-160 MG per tablet Take 1 tablet by mouth daily. Prevent infection    . vitamin B-12 (CYANOCOBALAMIN) 1000 MCG tablet Take 1,000 mcg by mouth daily.    . cephALEXin (KEFLEX) 500 MG capsule Take 1 capsule (500 mg total) by mouth 4 (four) times daily. 40 capsule 0  . ondansetron (ZOFRAN) 4 MG tablet Take 1 tablet (4 mg total) by mouth every 6 (six) hours. 12 tablet 0  . oxyCODONE (ROXICODONE) 5 MG immediate release tablet Take 1 tablet (5 mg total) by mouth every 4 (four) hours as needed for severe pain. (Patient not taking: Reported on 08/17/2014) 20 tablet 0  . saccharomyces boulardii (FLORASTOR) 250 MG capsule Take 1 capsule (250 mg total) by mouth 2 (two) times daily. 20 capsule 0    SOCIAL HISTORY: Married. She has been on disability since 2013 for clinical depression, IC, pelvic pain syndrome, back pain, and migraines.  PHYSICAL EXAM: BP 105/47 mmHg  Pulse 89  Temp(Src) 98.1 F (36.7 C) (Oral)  Resp 18  Ht 5\' 6"  (1.676 m)  Wt 150 lb (68.04 kg)  BMI 24.22 kg/m2  SpO2 97%  LMP 07/24/2014  (note, her weight is recorded as 150 - I think that this is an error.  Her real weight is closer to 200.)  General: WN obese WF who is alert.  HEENT: Normal. Pupils equal.  Neck: Supple. No mass. No thyroid mass.  Lymph Nodes: No supraclavicular or cervical nodes. Lungs: Clear to auscultation and  symmetric breath sounds. Heart: RRR. No murmur or rub. Abdomen: Soft. Has bowel sounds.   Points to tenderness along the lower right side of abdomen. There is no mass or peritoneal signs.  DATA REVIEWED: Epic notes and labs.  I spent about 1 hour with the patient - over 50% of the time face to face.  Alphonsa Overall, MD, [facility], [address] [place], [place] Phone: [phone]: [phone]

## 2014-08-17 NOTE — ED Notes (Signed)
Pt reports nausea x last few weeks.  Diarrhea x last 5 days, stating she can't feel when it comes on.  States on/off low grade fevers and abdominal pains.

## 2014-09-29 IMAGING — US US PELVIS COMPLETE
1 series · 13 of 25 positions shown · non-contrast
Comparison: CT 02/26/2014.

CLINICAL DATA: Pain.  History of endometriosis.

EXAM:
TRANSABDOMINAL ULTRASOUND OF PELVIS
TECHNIQUE: Transabdominal ultrasound examination of the pelvis was performed
including evaluation of the uterus, ovaries, adnexal regions, and
pelvic cul-de-sac.

[Series 1: us pelvis complete · 13 of 47 slices shown]
[im 1/47]
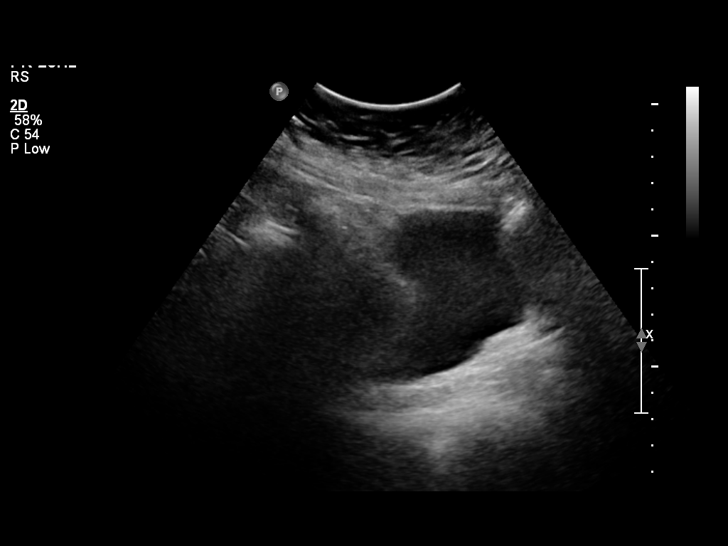
[im 4/47]
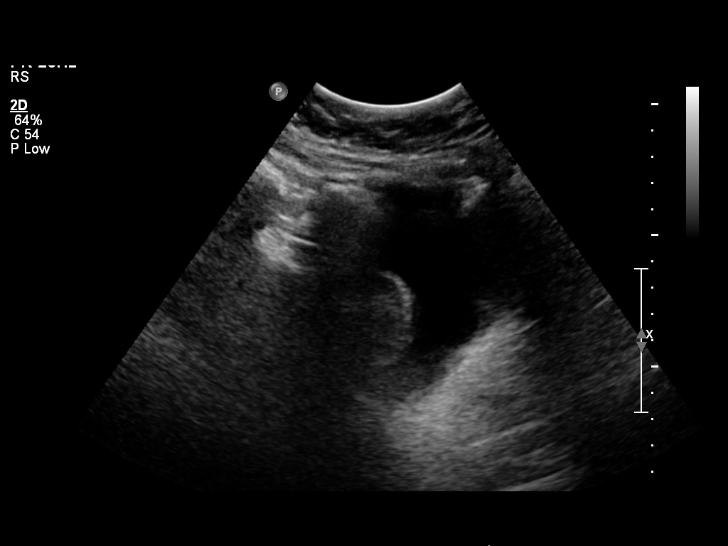
[im 8/47]
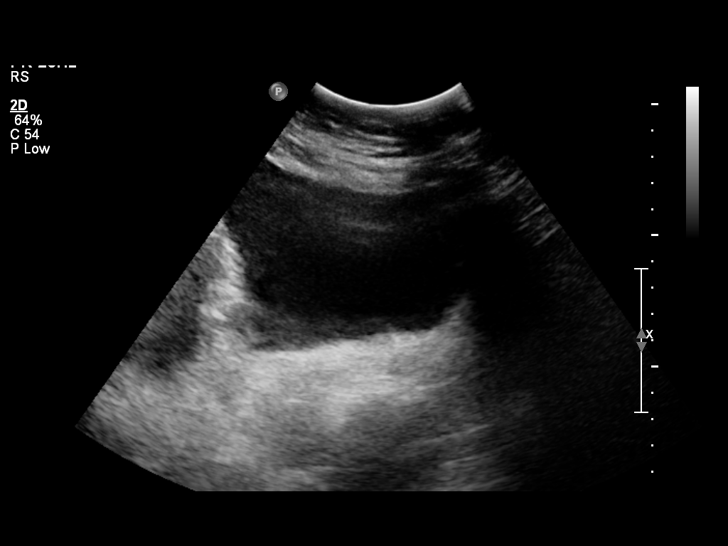
[im 12/47]
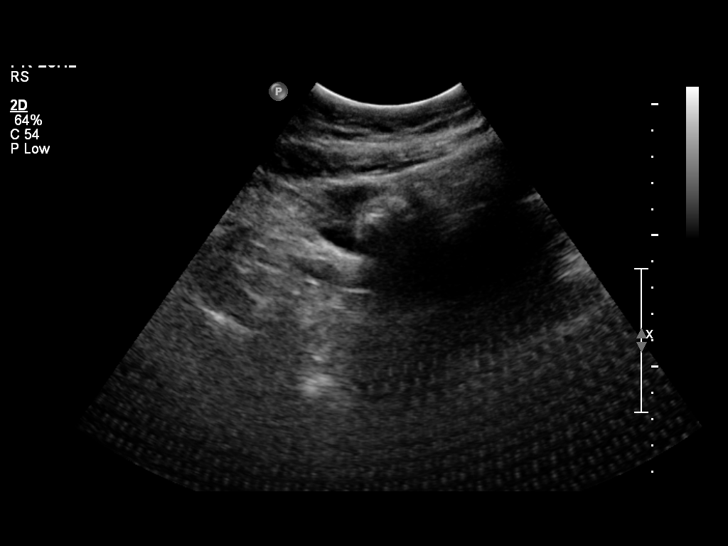
[im 16/47]
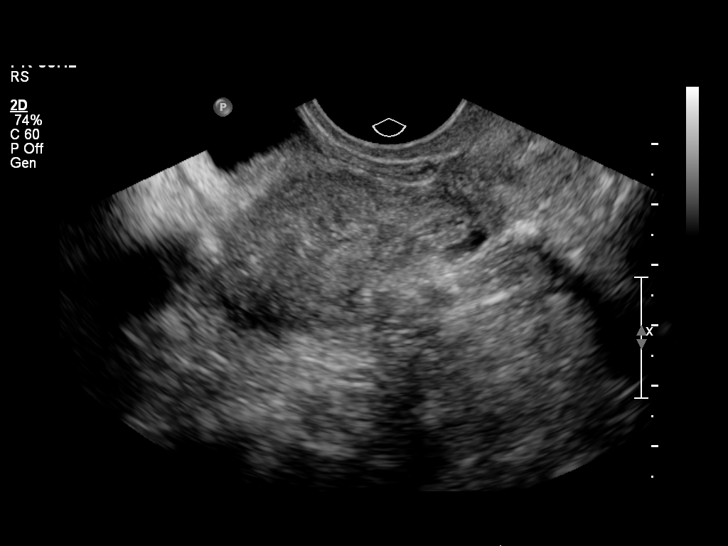
[im 20/47]
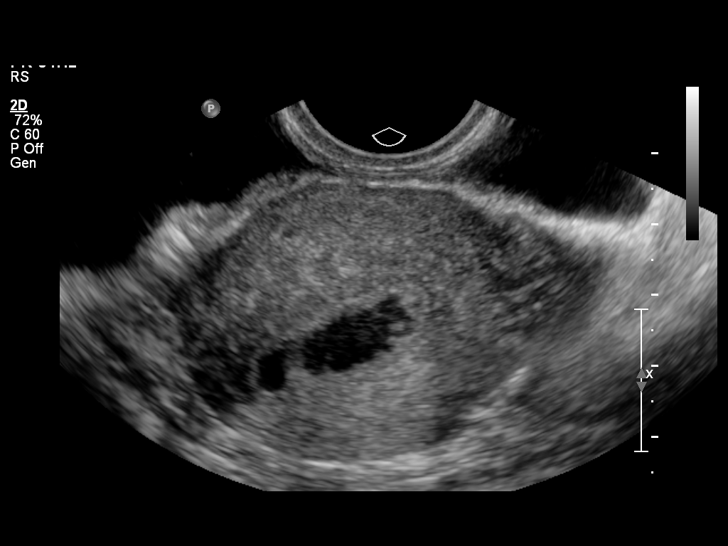
[im 24/47]
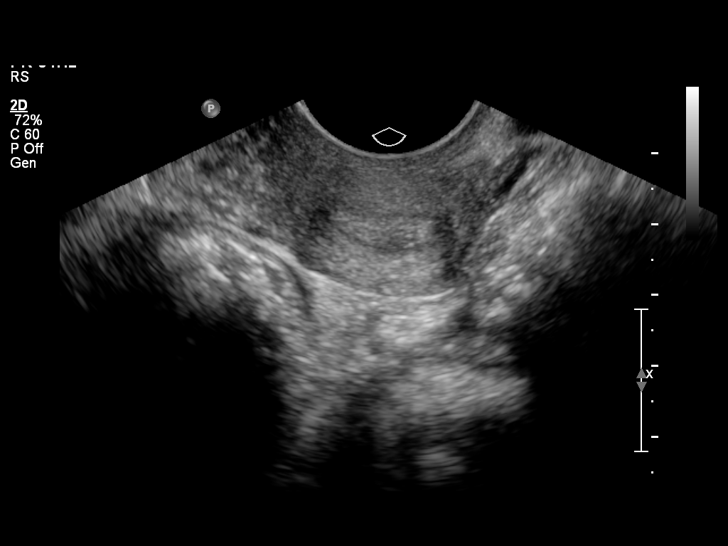
[im 27/47]
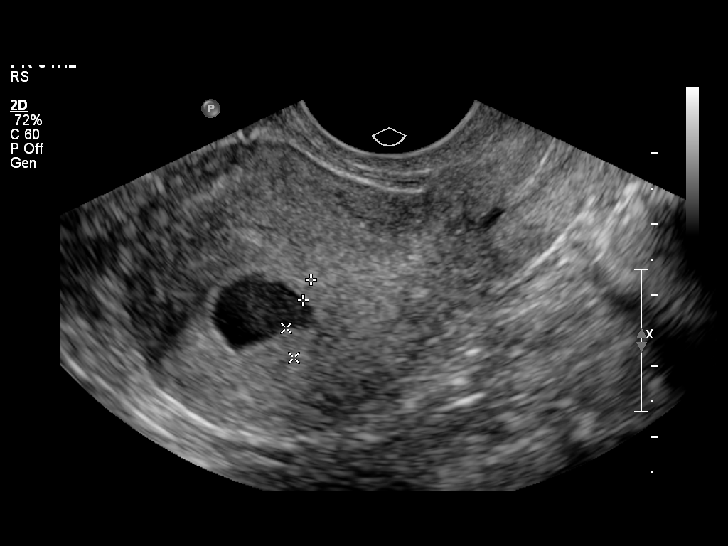
[im 31/47]
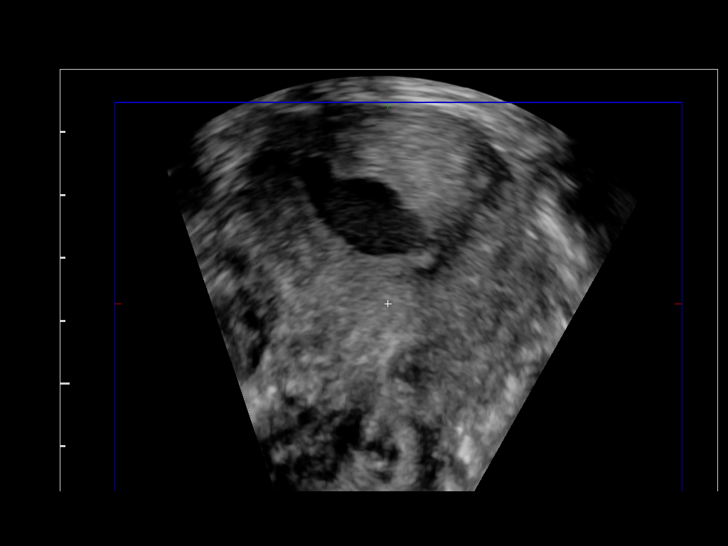
[im 35/47]
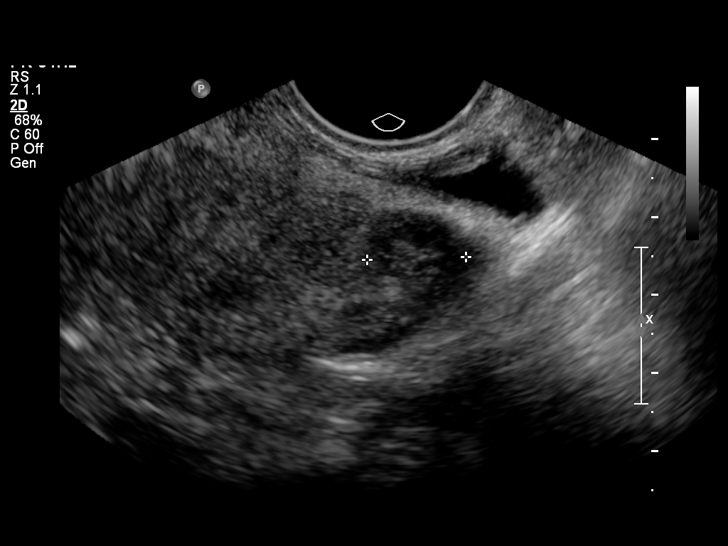
[im 39/47]
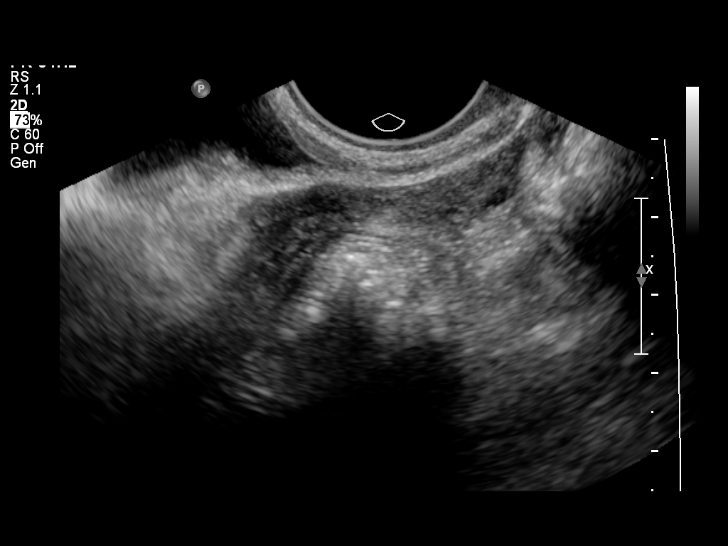
[im 43/47]
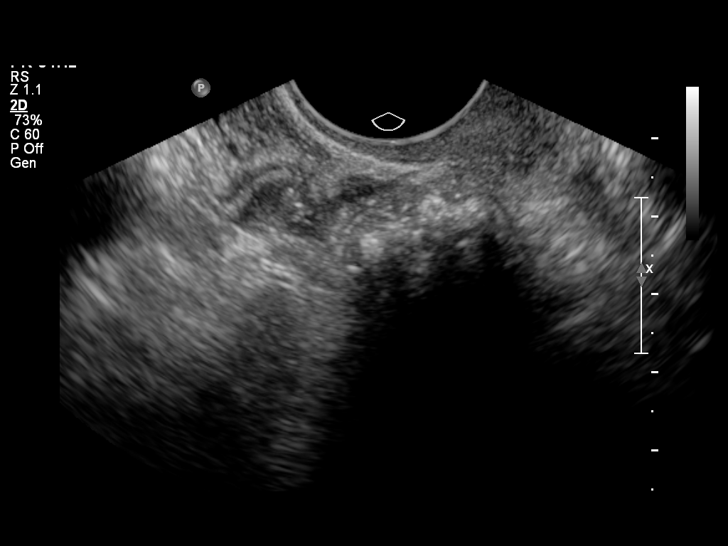
[im 47/47]
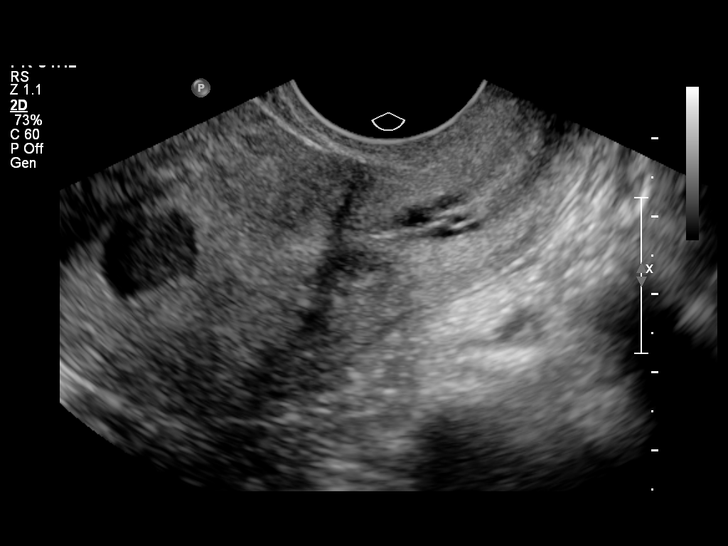

[13 of 25 positions shown; findings below may reference images not displayed]

FINDINGS: Uterus

Measurements: 7.6 x 4.2 x 4.9 cm.. No fibroids or other mass
visualized.

Endometrium

Thickness: 7 mm. Complex 1.5 x 1.2 cm fluid collection in the
endometrial cavity. The contours of this fluid collection are
irregular. Possible tiny solid nodular components are present.
Differential diagnosis includes endometritis, endometrial hemorrhage
secondary to endometrial pathology including early endometrial
malignancy, and atypical early pregnancy.

Right ovary

Not visualized. Right salpingectomy and oophorectomy by patient
history.

Left ovary

Measurements: 2.6 x 1.6 x 1.3 cm. Normal appearance/no adnexal mass.
Previously identified left adnexal cyst on CT of 02/26/2014 not
identified.

Other findings:  No free fluid
IMPRESSION: 1. Complex irregular 1.5 x 1.2 cm fluid collection in the
endometrial cavity. Possible tiny solid nodular components are
present. Differential diagnosis includes endometritis,endometrial
hemorrhage secondary to endometrial pathology including early
endometrial malignancy, and atypical early pregnancy.
2. Previously identified left adnexal cyst on CT of 02/26/2014 is
not identified.
3. Right ovary is not visualized. This is consistent with patient's
history of right salpingectomy and oophorectomy.

## 2014-11-02 DIAGNOSIS — Z79899 Other long term (current) drug therapy: Secondary | ICD-10-CM | POA: Diagnosis not present

## 2014-11-02 DIAGNOSIS — N3010 Interstitial cystitis (chronic) without hematuria: Secondary | ICD-10-CM | POA: Diagnosis not present

## 2015-01-05 DIAGNOSIS — F329 Major depressive disorder, single episode, unspecified: Secondary | ICD-10-CM | POA: Diagnosis not present

## 2015-01-05 DIAGNOSIS — Z833 Family history of diabetes mellitus: Secondary | ICD-10-CM | POA: Diagnosis not present

## 2015-01-05 DIAGNOSIS — I10 Essential (primary) hypertension: Secondary | ICD-10-CM | POA: Diagnosis not present

## 2015-01-25 DIAGNOSIS — R102 Pelvic and perineal pain: Secondary | ICD-10-CM | POA: Diagnosis not present

## 2015-01-25 DIAGNOSIS — F329 Major depressive disorder, single episode, unspecified: Secondary | ICD-10-CM | POA: Diagnosis not present

## 2015-01-25 DIAGNOSIS — R350 Frequency of micturition: Secondary | ICD-10-CM | POA: Diagnosis not present

## 2015-01-25 DIAGNOSIS — Z79899 Other long term (current) drug therapy: Secondary | ICD-10-CM | POA: Diagnosis not present

## 2015-01-25 DIAGNOSIS — N3010 Interstitial cystitis (chronic) without hematuria: Secondary | ICD-10-CM | POA: Diagnosis not present

## 2015-01-25 DIAGNOSIS — R8290 Unspecified abnormal findings in urine: Secondary | ICD-10-CM | POA: Diagnosis not present

## 2015-04-19 DIAGNOSIS — Z79899 Other long term (current) drug therapy: Secondary | ICD-10-CM | POA: Diagnosis not present

## 2015-04-19 DIAGNOSIS — R1030 Lower abdominal pain, unspecified: Secondary | ICD-10-CM | POA: Diagnosis not present

## 2015-04-19 DIAGNOSIS — R3915 Urgency of urination: Secondary | ICD-10-CM | POA: Diagnosis not present

## 2015-04-19 DIAGNOSIS — R8290 Unspecified abnormal findings in urine: Secondary | ICD-10-CM | POA: Diagnosis not present

## 2015-04-19 DIAGNOSIS — R350 Frequency of micturition: Secondary | ICD-10-CM | POA: Diagnosis not present

## 2015-04-19 DIAGNOSIS — R300 Dysuria: Secondary | ICD-10-CM | POA: Diagnosis not present

## 2015-04-19 DIAGNOSIS — N3010 Interstitial cystitis (chronic) without hematuria: Secondary | ICD-10-CM | POA: Diagnosis not present

## 2015-04-19 DIAGNOSIS — N941 Dyspareunia: Secondary | ICD-10-CM | POA: Diagnosis not present

## 2015-07-06 DIAGNOSIS — Z1231 Encounter for screening mammogram for malignant neoplasm of breast: Secondary | ICD-10-CM | POA: Diagnosis not present

## 2015-07-06 DIAGNOSIS — Z01419 Encounter for gynecological examination (general) (routine) without abnormal findings: Secondary | ICD-10-CM | POA: Diagnosis not present

## 2015-07-06 DIAGNOSIS — Z1212 Encounter for screening for malignant neoplasm of rectum: Secondary | ICD-10-CM | POA: Diagnosis not present

## 2015-07-06 DIAGNOSIS — Z6835 Body mass index (BMI) 35.0-35.9, adult: Secondary | ICD-10-CM | POA: Diagnosis not present

## 2015-07-12 DIAGNOSIS — N3010 Interstitial cystitis (chronic) without hematuria: Secondary | ICD-10-CM | POA: Diagnosis not present

## 2015-07-12 DIAGNOSIS — Z79891 Long term (current) use of opiate analgesic: Secondary | ICD-10-CM | POA: Diagnosis not present

## 2015-07-12 DIAGNOSIS — R1030 Lower abdominal pain, unspecified: Secondary | ICD-10-CM | POA: Diagnosis not present

## 2015-07-12 DIAGNOSIS — R350 Frequency of micturition: Secondary | ICD-10-CM | POA: Diagnosis not present

## 2015-07-12 DIAGNOSIS — R3915 Urgency of urination: Secondary | ICD-10-CM | POA: Diagnosis not present

## 2015-07-12 DIAGNOSIS — Z79899 Other long term (current) drug therapy: Secondary | ICD-10-CM | POA: Diagnosis not present

## 2015-10-14 DIAGNOSIS — N3010 Interstitial cystitis (chronic) without hematuria: Secondary | ICD-10-CM | POA: Diagnosis not present

## 2015-10-14 DIAGNOSIS — Z79891 Long term (current) use of opiate analgesic: Secondary | ICD-10-CM | POA: Diagnosis not present

## 2015-12-21 DIAGNOSIS — G894 Chronic pain syndrome: Secondary | ICD-10-CM | POA: Diagnosis not present

## 2015-12-21 DIAGNOSIS — M797 Fibromyalgia: Secondary | ICD-10-CM | POA: Diagnosis not present

## 2015-12-21 DIAGNOSIS — N3010 Interstitial cystitis (chronic) without hematuria: Secondary | ICD-10-CM | POA: Diagnosis not present

## 2015-12-21 DIAGNOSIS — F332 Major depressive disorder, recurrent severe without psychotic features: Secondary | ICD-10-CM | POA: Diagnosis not present

## 2015-12-21 DIAGNOSIS — I10 Essential (primary) hypertension: Secondary | ICD-10-CM | POA: Diagnosis not present

## 2016-01-11 DIAGNOSIS — Z79891 Long term (current) use of opiate analgesic: Secondary | ICD-10-CM | POA: Diagnosis not present

## 2016-01-11 DIAGNOSIS — N3010 Interstitial cystitis (chronic) without hematuria: Secondary | ICD-10-CM | POA: Diagnosis not present

## 2016-01-11 DIAGNOSIS — Z7901 Long term (current) use of anticoagulants: Secondary | ICD-10-CM | POA: Diagnosis not present

## 2016-01-11 DIAGNOSIS — Z760 Encounter for issue of repeat prescription: Secondary | ICD-10-CM | POA: Diagnosis not present

## 2016-04-06 DIAGNOSIS — N3010 Interstitial cystitis (chronic) without hematuria: Secondary | ICD-10-CM | POA: Diagnosis not present

## 2016-04-10 DIAGNOSIS — I10 Essential (primary) hypertension: Secondary | ICD-10-CM | POA: Diagnosis not present

## 2016-04-10 DIAGNOSIS — M797 Fibromyalgia: Secondary | ICD-10-CM | POA: Diagnosis not present

## 2016-04-10 DIAGNOSIS — Z9884 Bariatric surgery status: Secondary | ICD-10-CM | POA: Diagnosis not present

## 2016-04-10 DIAGNOSIS — F332 Major depressive disorder, recurrent severe without psychotic features: Secondary | ICD-10-CM | POA: Diagnosis not present

## 2016-04-10 DIAGNOSIS — G894 Chronic pain syndrome: Secondary | ICD-10-CM | POA: Diagnosis not present

## 2016-04-10 DIAGNOSIS — Z0000 Encounter for general adult medical examination without abnormal findings: Secondary | ICD-10-CM | POA: Diagnosis not present

## 2016-05-08 DIAGNOSIS — Z79891 Long term (current) use of opiate analgesic: Secondary | ICD-10-CM | POA: Diagnosis not present

## 2016-05-08 DIAGNOSIS — G43701 Chronic migraine without aura, not intractable, with status migrainosus: Secondary | ICD-10-CM | POA: Diagnosis not present

## 2016-05-08 DIAGNOSIS — R350 Frequency of micturition: Secondary | ICD-10-CM | POA: Diagnosis not present

## 2016-05-08 DIAGNOSIS — R3915 Urgency of urination: Secondary | ICD-10-CM | POA: Diagnosis not present

## 2016-05-08 DIAGNOSIS — N941 Unspecified dyspareunia: Secondary | ICD-10-CM | POA: Diagnosis not present

## 2016-05-08 DIAGNOSIS — N3010 Interstitial cystitis (chronic) without hematuria: Secondary | ICD-10-CM | POA: Diagnosis not present

## 2016-05-08 DIAGNOSIS — M797 Fibromyalgia: Secondary | ICD-10-CM | POA: Diagnosis not present

## 2016-05-08 DIAGNOSIS — R300 Dysuria: Secondary | ICD-10-CM | POA: Diagnosis not present

## 2016-05-08 DIAGNOSIS — R1030 Lower abdominal pain, unspecified: Secondary | ICD-10-CM | POA: Diagnosis not present

## 2016-07-26 DIAGNOSIS — R8290 Unspecified abnormal findings in urine: Secondary | ICD-10-CM | POA: Diagnosis not present

## 2016-07-26 DIAGNOSIS — M797 Fibromyalgia: Secondary | ICD-10-CM | POA: Diagnosis not present

## 2016-07-26 DIAGNOSIS — M545 Low back pain: Secondary | ICD-10-CM | POA: Diagnosis not present

## 2016-07-26 DIAGNOSIS — R3915 Urgency of urination: Secondary | ICD-10-CM | POA: Diagnosis not present

## 2016-07-26 DIAGNOSIS — N3010 Interstitial cystitis (chronic) without hematuria: Secondary | ICD-10-CM | POA: Diagnosis not present

## 2016-07-26 DIAGNOSIS — Z79891 Long term (current) use of opiate analgesic: Secondary | ICD-10-CM | POA: Diagnosis not present

## 2016-07-26 DIAGNOSIS — N941 Unspecified dyspareunia: Secondary | ICD-10-CM | POA: Diagnosis not present

## 2016-07-26 DIAGNOSIS — R1030 Lower abdominal pain, unspecified: Secondary | ICD-10-CM | POA: Diagnosis not present

## 2016-07-26 DIAGNOSIS — R300 Dysuria: Secondary | ICD-10-CM | POA: Diagnosis not present

## 2016-08-07 DIAGNOSIS — D509 Iron deficiency anemia, unspecified: Secondary | ICD-10-CM | POA: Diagnosis not present

## 2016-08-07 DIAGNOSIS — I10 Essential (primary) hypertension: Secondary | ICD-10-CM | POA: Diagnosis not present

## 2016-09-27 DIAGNOSIS — J0100 Acute maxillary sinusitis, unspecified: Secondary | ICD-10-CM | POA: Diagnosis not present

## 2016-10-09 DIAGNOSIS — I10 Essential (primary) hypertension: Secondary | ICD-10-CM | POA: Diagnosis not present

## 2016-10-09 DIAGNOSIS — M5442 Lumbago with sciatica, left side: Secondary | ICD-10-CM | POA: Diagnosis not present

## 2016-10-09 DIAGNOSIS — G894 Chronic pain syndrome: Secondary | ICD-10-CM | POA: Diagnosis not present

## 2016-10-09 DIAGNOSIS — G8929 Other chronic pain: Secondary | ICD-10-CM | POA: Diagnosis not present

## 2016-10-18 DIAGNOSIS — G43701 Chronic migraine without aura, not intractable, with status migrainosus: Secondary | ICD-10-CM | POA: Diagnosis not present

## 2016-10-18 DIAGNOSIS — R3915 Urgency of urination: Secondary | ICD-10-CM | POA: Diagnosis not present

## 2016-10-18 DIAGNOSIS — M545 Low back pain: Secondary | ICD-10-CM | POA: Diagnosis not present

## 2016-10-18 DIAGNOSIS — R1030 Lower abdominal pain, unspecified: Secondary | ICD-10-CM | POA: Diagnosis not present

## 2016-10-18 DIAGNOSIS — N3010 Interstitial cystitis (chronic) without hematuria: Secondary | ICD-10-CM | POA: Diagnosis not present

## 2016-10-18 DIAGNOSIS — R350 Frequency of micturition: Secondary | ICD-10-CM | POA: Diagnosis not present

## 2016-10-18 DIAGNOSIS — Z79891 Long term (current) use of opiate analgesic: Secondary | ICD-10-CM | POA: Diagnosis not present

## 2016-10-18 DIAGNOSIS — R300 Dysuria: Secondary | ICD-10-CM | POA: Diagnosis not present

## 2016-10-18 DIAGNOSIS — N941 Unspecified dyspareunia: Secondary | ICD-10-CM | POA: Diagnosis not present

## 2017-01-17 DIAGNOSIS — N3010 Interstitial cystitis (chronic) without hematuria: Secondary | ICD-10-CM | POA: Diagnosis not present

## 2017-01-17 DIAGNOSIS — R350 Frequency of micturition: Secondary | ICD-10-CM | POA: Diagnosis not present

## 2017-01-17 DIAGNOSIS — R3915 Urgency of urination: Secondary | ICD-10-CM | POA: Diagnosis not present

## 2017-01-17 DIAGNOSIS — Z79891 Long term (current) use of opiate analgesic: Secondary | ICD-10-CM | POA: Diagnosis not present

## 2017-01-17 DIAGNOSIS — R1030 Lower abdominal pain, unspecified: Secondary | ICD-10-CM | POA: Diagnosis not present

## 2017-01-17 DIAGNOSIS — M797 Fibromyalgia: Secondary | ICD-10-CM | POA: Diagnosis not present

## 2017-09-27 DIAGNOSIS — G894 Chronic pain syndrome: Secondary | ICD-10-CM | POA: Diagnosis not present

## 2017-09-27 DIAGNOSIS — M797 Fibromyalgia: Secondary | ICD-10-CM | POA: Diagnosis not present

## 2017-09-27 DIAGNOSIS — I10 Essential (primary) hypertension: Secondary | ICD-10-CM | POA: Diagnosis not present

## 2017-09-27 DIAGNOSIS — F332 Major depressive disorder, recurrent severe without psychotic features: Secondary | ICD-10-CM | POA: Diagnosis not present

## 2017-09-27 DIAGNOSIS — N3010 Interstitial cystitis (chronic) without hematuria: Secondary | ICD-10-CM | POA: Diagnosis not present

## 2017-12-05 ENCOUNTER — Ambulatory Visit (HOSPITAL_COMMUNITY): Payer: Medicare HMO | Admitting: Psychiatry

## 2018-01-01 DIAGNOSIS — M797 Fibromyalgia: Secondary | ICD-10-CM | POA: Diagnosis not present

## 2018-01-01 DIAGNOSIS — G8929 Other chronic pain: Secondary | ICD-10-CM | POA: Diagnosis not present

## 2018-01-01 DIAGNOSIS — G894 Chronic pain syndrome: Secondary | ICD-10-CM | POA: Diagnosis not present

## 2018-01-01 DIAGNOSIS — M5412 Radiculopathy, cervical region: Secondary | ICD-10-CM | POA: Diagnosis not present

## 2018-01-01 DIAGNOSIS — Z79899 Other long term (current) drug therapy: Secondary | ICD-10-CM | POA: Diagnosis not present

## 2018-01-01 DIAGNOSIS — Z5181 Encounter for therapeutic drug level monitoring: Secondary | ICD-10-CM | POA: Diagnosis not present

## 2018-01-01 DIAGNOSIS — M546 Pain in thoracic spine: Secondary | ICD-10-CM | POA: Diagnosis not present

## 2018-01-01 DIAGNOSIS — I10 Essential (primary) hypertension: Secondary | ICD-10-CM | POA: Diagnosis not present

## 2018-01-07 DIAGNOSIS — N3010 Interstitial cystitis (chronic) without hematuria: Secondary | ICD-10-CM | POA: Diagnosis not present

## 2018-01-20 DIAGNOSIS — N3010 Interstitial cystitis (chronic) without hematuria: Secondary | ICD-10-CM | POA: Diagnosis not present

## 2018-02-03 DIAGNOSIS — Z79899 Other long term (current) drug therapy: Secondary | ICD-10-CM | POA: Diagnosis not present

## 2018-02-03 DIAGNOSIS — M546 Pain in thoracic spine: Secondary | ICD-10-CM | POA: Diagnosis not present

## 2018-02-03 DIAGNOSIS — Z5181 Encounter for therapeutic drug level monitoring: Secondary | ICD-10-CM | POA: Diagnosis not present

## 2018-02-03 DIAGNOSIS — R102 Pelvic and perineal pain: Secondary | ICD-10-CM | POA: Diagnosis not present

## 2018-02-03 DIAGNOSIS — N3010 Interstitial cystitis (chronic) without hematuria: Secondary | ICD-10-CM | POA: Diagnosis not present

## 2018-02-03 DIAGNOSIS — I10 Essential (primary) hypertension: Secondary | ICD-10-CM | POA: Diagnosis not present

## 2018-02-03 DIAGNOSIS — M5412 Radiculopathy, cervical region: Secondary | ICD-10-CM | POA: Diagnosis not present

## 2018-03-28 DIAGNOSIS — M797 Fibromyalgia: Secondary | ICD-10-CM | POA: Diagnosis not present

## 2018-03-28 DIAGNOSIS — M419 Scoliosis, unspecified: Secondary | ICD-10-CM | POA: Diagnosis not present

## 2018-03-28 DIAGNOSIS — G894 Chronic pain syndrome: Secondary | ICD-10-CM | POA: Diagnosis not present

## 2018-03-28 DIAGNOSIS — Z79899 Other long term (current) drug therapy: Secondary | ICD-10-CM | POA: Diagnosis not present

## 2018-04-04 DIAGNOSIS — M797 Fibromyalgia: Secondary | ICD-10-CM | POA: Diagnosis not present

## 2018-04-04 DIAGNOSIS — Z79899 Other long term (current) drug therapy: Secondary | ICD-10-CM | POA: Diagnosis not present

## 2018-04-04 DIAGNOSIS — G894 Chronic pain syndrome: Secondary | ICD-10-CM | POA: Diagnosis not present

## 2018-05-05 DIAGNOSIS — M797 Fibromyalgia: Secondary | ICD-10-CM | POA: Diagnosis not present

## 2018-05-05 DIAGNOSIS — Z79899 Other long term (current) drug therapy: Secondary | ICD-10-CM | POA: Diagnosis not present

## 2018-05-05 DIAGNOSIS — G894 Chronic pain syndrome: Secondary | ICD-10-CM | POA: Diagnosis not present

## 2018-06-05 DIAGNOSIS — Z79899 Other long term (current) drug therapy: Secondary | ICD-10-CM | POA: Diagnosis not present

## 2018-06-05 DIAGNOSIS — G894 Chronic pain syndrome: Secondary | ICD-10-CM | POA: Diagnosis not present

## 2018-06-05 DIAGNOSIS — M797 Fibromyalgia: Secondary | ICD-10-CM | POA: Diagnosis not present

## 2018-07-04 DIAGNOSIS — M797 Fibromyalgia: Secondary | ICD-10-CM | POA: Diagnosis not present

## 2018-07-04 DIAGNOSIS — Z79899 Other long term (current) drug therapy: Secondary | ICD-10-CM | POA: Diagnosis not present

## 2018-07-04 DIAGNOSIS — G894 Chronic pain syndrome: Secondary | ICD-10-CM | POA: Diagnosis not present

## 2018-08-04 DIAGNOSIS — Z79899 Other long term (current) drug therapy: Secondary | ICD-10-CM | POA: Diagnosis not present

## 2018-08-04 DIAGNOSIS — G894 Chronic pain syndrome: Secondary | ICD-10-CM | POA: Diagnosis not present

## 2018-08-04 DIAGNOSIS — M797 Fibromyalgia: Secondary | ICD-10-CM | POA: Diagnosis not present

## 2018-09-03 DIAGNOSIS — G894 Chronic pain syndrome: Secondary | ICD-10-CM | POA: Diagnosis not present

## 2018-09-03 DIAGNOSIS — Z79899 Other long term (current) drug therapy: Secondary | ICD-10-CM | POA: Diagnosis not present

## 2018-09-03 DIAGNOSIS — M797 Fibromyalgia: Secondary | ICD-10-CM | POA: Diagnosis not present

## 2018-10-01 DIAGNOSIS — Z79899 Other long term (current) drug therapy: Secondary | ICD-10-CM | POA: Diagnosis not present

## 2018-10-01 DIAGNOSIS — G894 Chronic pain syndrome: Secondary | ICD-10-CM | POA: Diagnosis not present

## 2018-10-01 DIAGNOSIS — M797 Fibromyalgia: Secondary | ICD-10-CM | POA: Diagnosis not present

## 2018-10-31 DIAGNOSIS — Z79899 Other long term (current) drug therapy: Secondary | ICD-10-CM | POA: Diagnosis not present

## 2018-10-31 DIAGNOSIS — F1120 Opioid dependence, uncomplicated: Secondary | ICD-10-CM | POA: Diagnosis not present

## 2018-10-31 DIAGNOSIS — M797 Fibromyalgia: Secondary | ICD-10-CM | POA: Diagnosis not present

## 2018-10-31 DIAGNOSIS — G894 Chronic pain syndrome: Secondary | ICD-10-CM | POA: Diagnosis not present

## 2018-11-28 DIAGNOSIS — M797 Fibromyalgia: Secondary | ICD-10-CM | POA: Diagnosis not present

## 2018-11-28 DIAGNOSIS — F1120 Opioid dependence, uncomplicated: Secondary | ICD-10-CM | POA: Diagnosis not present

## 2018-11-28 DIAGNOSIS — G894 Chronic pain syndrome: Secondary | ICD-10-CM | POA: Diagnosis not present

## 2018-11-28 DIAGNOSIS — Z79899 Other long term (current) drug therapy: Secondary | ICD-10-CM | POA: Diagnosis not present

## 2018-12-26 DIAGNOSIS — F1120 Opioid dependence, uncomplicated: Secondary | ICD-10-CM | POA: Diagnosis not present

## 2018-12-26 DIAGNOSIS — Z79899 Other long term (current) drug therapy: Secondary | ICD-10-CM | POA: Diagnosis not present

## 2018-12-26 DIAGNOSIS — M797 Fibromyalgia: Secondary | ICD-10-CM | POA: Diagnosis not present

## 2018-12-26 DIAGNOSIS — G894 Chronic pain syndrome: Secondary | ICD-10-CM | POA: Diagnosis not present

## 2019-08-28 ENCOUNTER — Other Ambulatory Visit: Payer: Self-pay

## 2019-08-28 DIAGNOSIS — Z20822 Contact with and (suspected) exposure to covid-19: Secondary | ICD-10-CM

## 2019-08-30 ENCOUNTER — Telehealth: Payer: Self-pay

## 2019-08-30 NOTE — Telephone Encounter (Signed)
Pt called for test results and advised that results are not back. Sent pt MyChart enrollment link.

## 2019-08-31 ENCOUNTER — Ambulatory Visit: Payer: Self-pay

## 2019-08-31 NOTE — Telephone Encounter (Signed)
Results npt available

## 2019-09-01 ENCOUNTER — Telehealth: Payer: Self-pay | Admitting: *Deleted

## 2019-09-01 LAB — NOVEL CORONAVIRUS, NAA: SARS-CoV-2, NAA: NOT DETECTED

## 2019-09-01 NOTE — Telephone Encounter (Signed)
Pt called in was asking for the interpretation of her COVID-19 being not detected.   I let her know it meant she did not have the virus. She was exposed to someone who was positive on Thanksgiving day.    I let her know she needed to quarantine for 14 days which would be through 09/02/2019.  I answered her questions.

## 2020-03-31 ENCOUNTER — Other Ambulatory Visit: Payer: Self-pay | Admitting: Physician Assistant

## 2020-03-31 DIAGNOSIS — M5410 Radiculopathy, site unspecified: Secondary | ICD-10-CM

## 2020-05-13 ENCOUNTER — Other Ambulatory Visit: Payer: Self-pay | Admitting: Physician Assistant

## 2020-05-13 DIAGNOSIS — M5410 Radiculopathy, site unspecified: Secondary | ICD-10-CM

## 2020-05-14 ENCOUNTER — Other Ambulatory Visit: Payer: Self-pay

## 2020-05-14 ENCOUNTER — Ambulatory Visit
Admission: RE | Admit: 2020-05-14 | Discharge: 2020-05-14 | Disposition: A | Discharge status: Home / Self Care | Payer: Medicare HMO | Source: Ambulatory Visit | Attending: Physician Assistant | Admitting: Physician Assistant

## 2020-05-14 DIAGNOSIS — M5410 Radiculopathy, site unspecified: Secondary | ICD-10-CM

## 2021-12-28 ENCOUNTER — Other Ambulatory Visit: Payer: Self-pay | Admitting: Nurse Practitioner

## 2021-12-28 ENCOUNTER — Other Ambulatory Visit (HOSPITAL_COMMUNITY): Payer: Self-pay | Admitting: Nurse Practitioner

## 2021-12-28 DIAGNOSIS — R519 Headache, unspecified: Secondary | ICD-10-CM

## 2022-01-25 ENCOUNTER — Encounter (HOSPITAL_COMMUNITY): Payer: Self-pay

## 2022-01-25 ENCOUNTER — Ambulatory Visit (HOSPITAL_COMMUNITY): Payer: Medicare HMO

## 2022-03-09 ENCOUNTER — Ambulatory Visit (HOSPITAL_COMMUNITY)
Admission: RE | Admit: 2022-03-09 | Discharge: 2022-03-09 | Disposition: A | Discharge status: Home / Self Care | Payer: Medicare HMO | Source: Ambulatory Visit | Attending: Nurse Practitioner | Admitting: Nurse Practitioner

## 2022-03-09 DIAGNOSIS — R519 Headache, unspecified: Secondary | ICD-10-CM | POA: Insufficient documentation

## 2022-05-14 ENCOUNTER — Ambulatory Visit: Payer: Medicare HMO | Admitting: Podiatry

## 2022-06-04 ENCOUNTER — Ambulatory Visit: Payer: Medicare HMO | Admitting: Podiatry

## 2022-09-23 IMAGING — CT CT HEAD W/O CM
3 of 6 series · 12 of 47 positions shown, 14 images · non-contrast
Comparison: None Available.

CLINICAL DATA: Pt hit back of head several days ago Continued
headache



[Series 3: head 5.0 h30s · axial · 0.44mm/px · z∈[+970,+1085]mm · 8 of 29 slices shown, 10 images]
[im 3/29  brain]
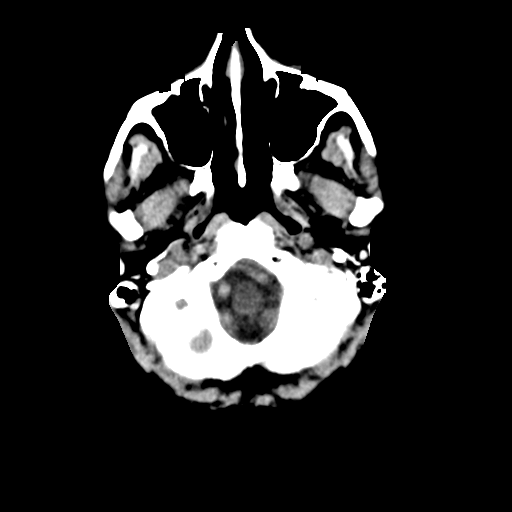
[im 3/29  bone]
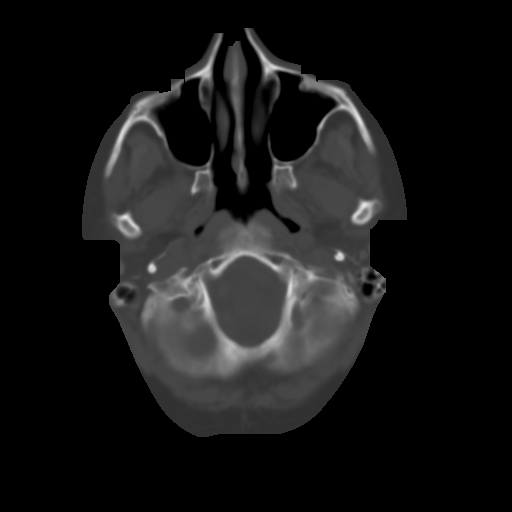
[im 6/29  brain]
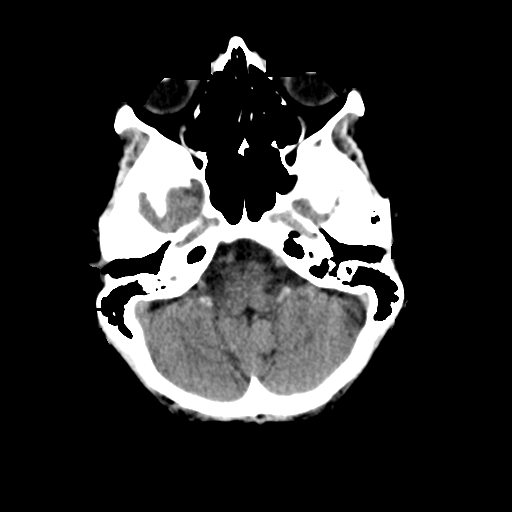
[im 9/29  brain]
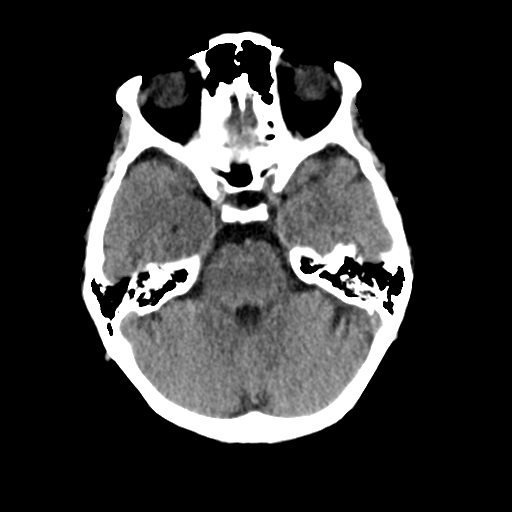
[im 13/29  brain]
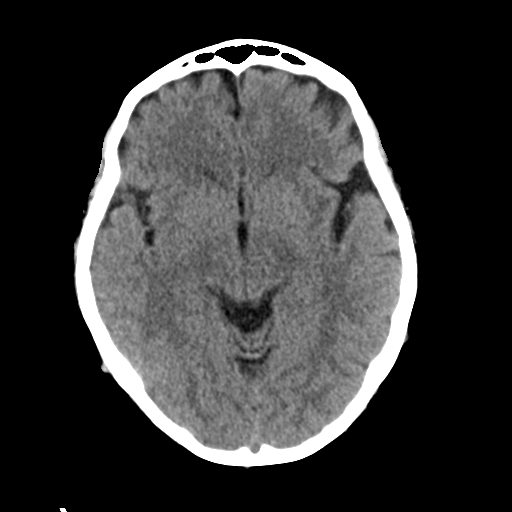
[im 16/29  brain]
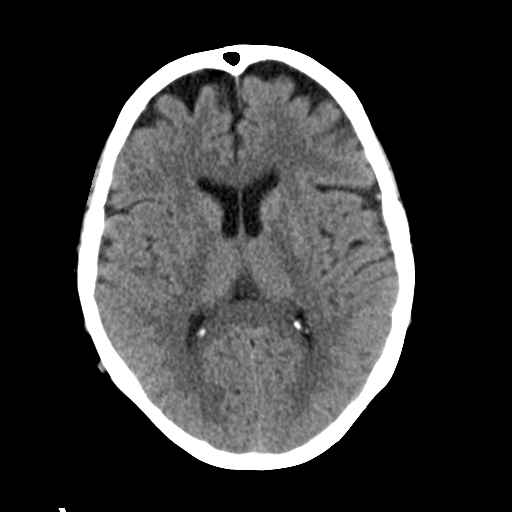
[im 16/29  bone]
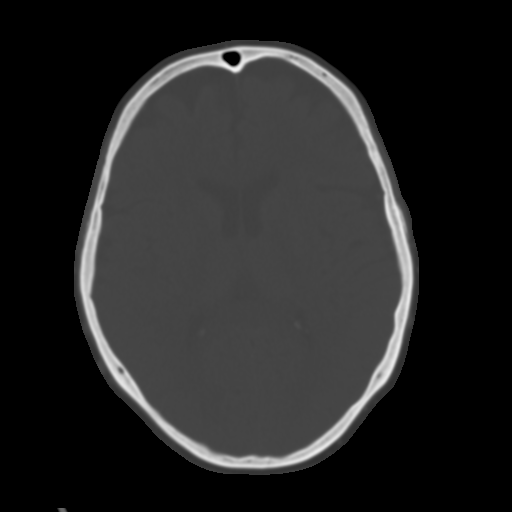
[im 20/29  brain]
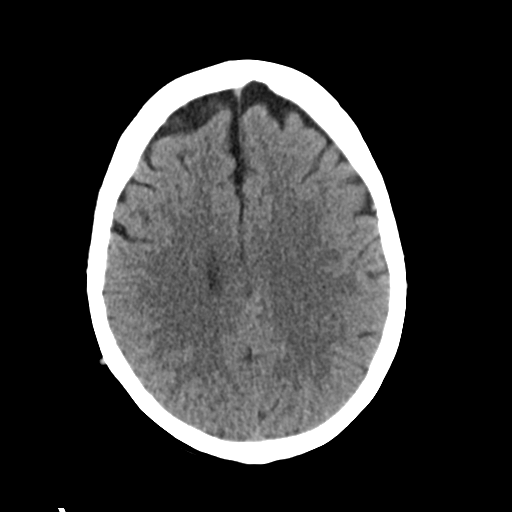
[im 23/29  brain]
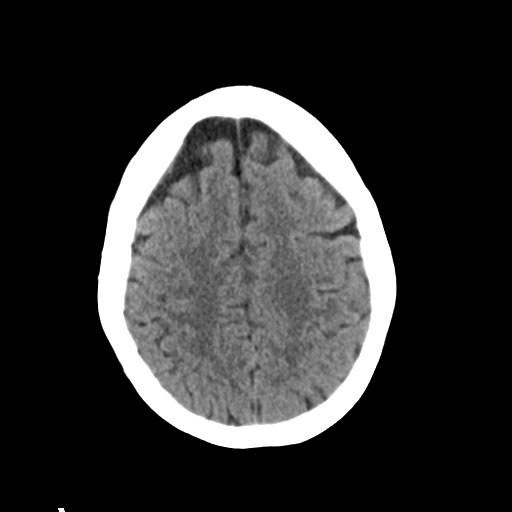
[im 26/29  brain]
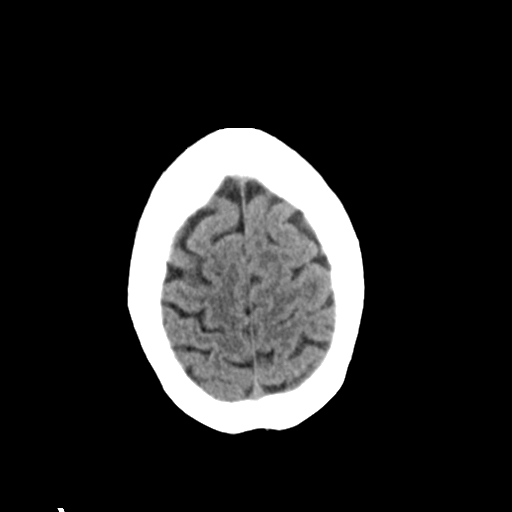

[Series 6: head 3.0 mpr sag · sagittal · 0.28mm/px · 2 of 67 slices shown]
[im 23/67  brain]
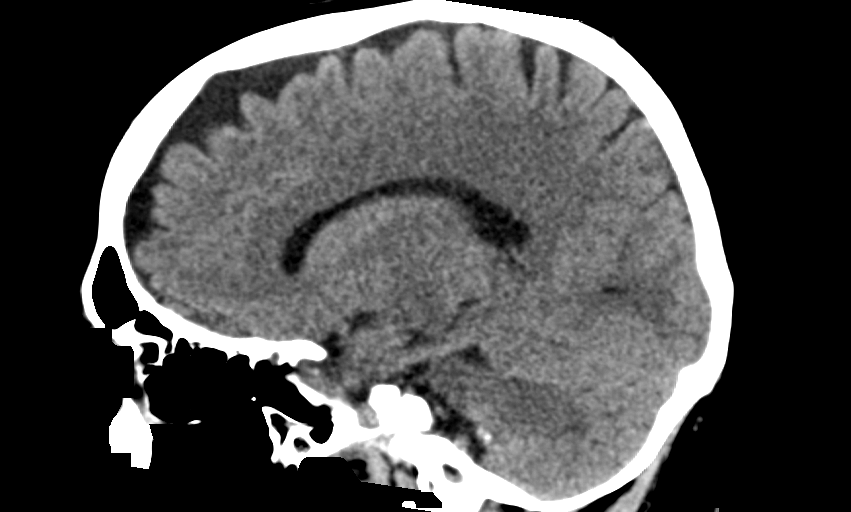
[im 45/67  brain]
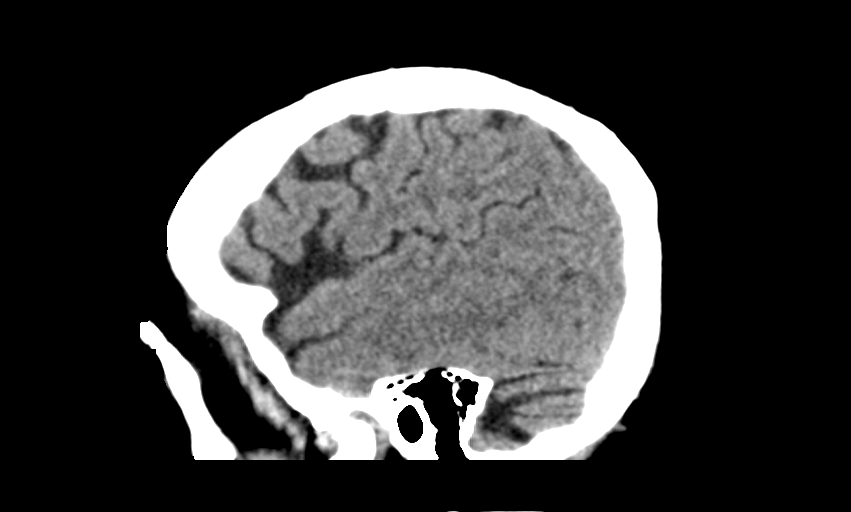

[Series 9: head 3.0 mpr cor · coronal · 0.10mm/px · 2 of 45 slices shown]
[im 15/45  brain]
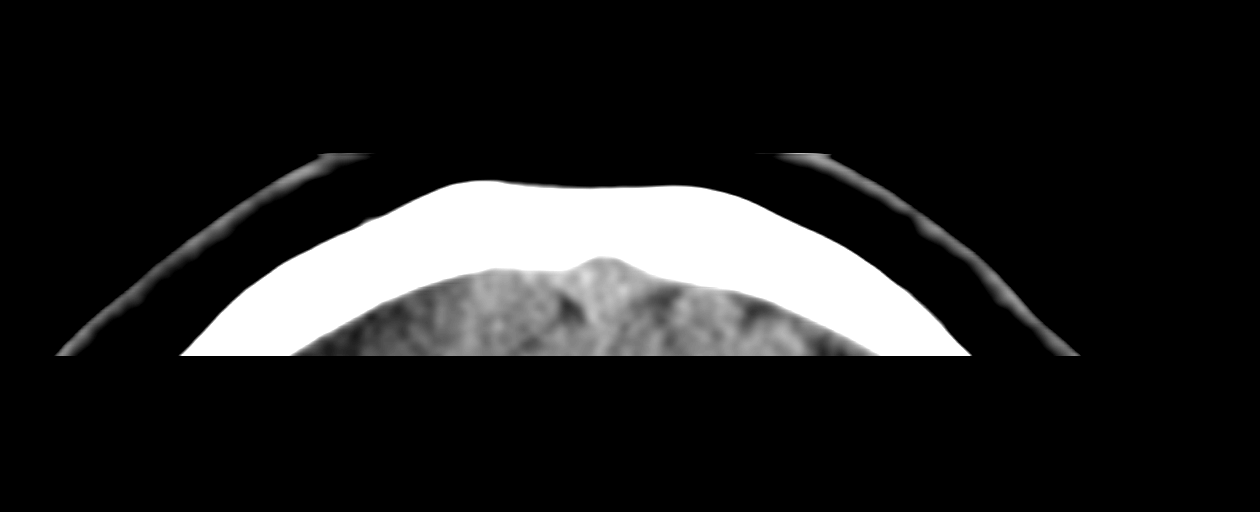
[im 30/45  brain]
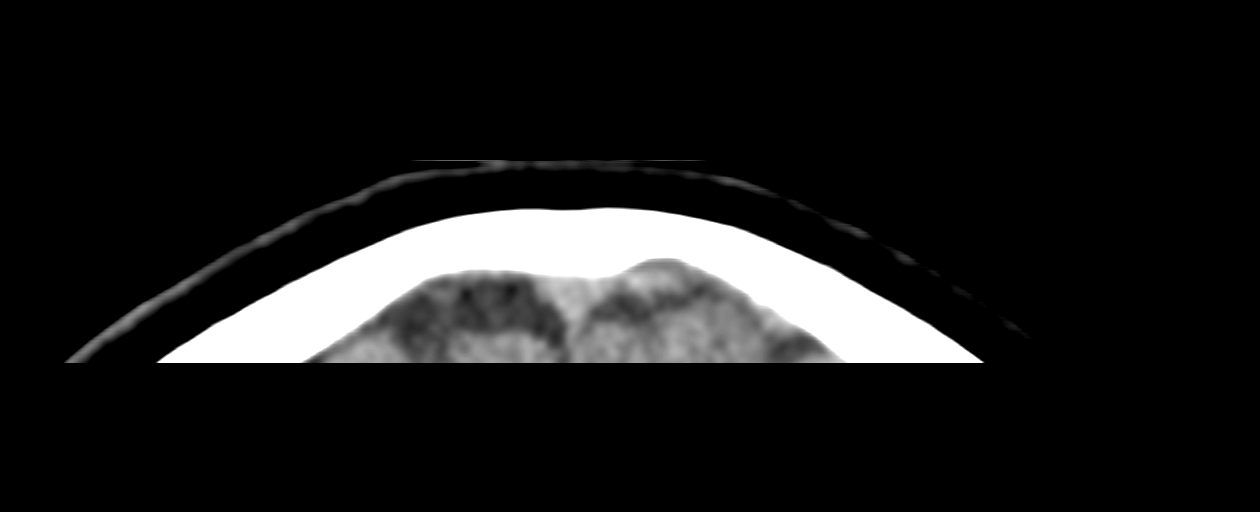

[12 of 47 positions shown; findings below may reference images not displayed]

FINDINGS: Brain:

No evidence of large-territorial acute infarction. No parenchymal
hemorrhage. No mass lesion. No extra-axial collection.

No mass effect or midline shift. No hydrocephalus. Basilar cisterns
are patent.

Vascular: No hyperdense vessel.

Skull: No acute fracture or focal lesion.

Sinuses/Orbits: Paranasal sinuses and mastoid air cells are clear.
The orbits are unremarkable.

Other: None.
IMPRESSION: No acute intracranial abnormality.

## 2022-11-05 ENCOUNTER — Other Ambulatory Visit (HOSPITAL_COMMUNITY): Payer: Self-pay | Admitting: Internal Medicine

## 2022-11-05 DIAGNOSIS — Z1231 Encounter for screening mammogram for malignant neoplasm of breast: Secondary | ICD-10-CM
